# Patient Record
Sex: Female | Born: 1935
Health system: Southern US, Community
[De-identification: ages and names within clinical notes are randomized; demographics above are authoritative.]

## PROBLEM LIST (undated history)

## (undated) DIAGNOSIS — Z9189 Other specified personal risk factors, not elsewhere classified: Secondary | ICD-10-CM

## (undated) DIAGNOSIS — I10 Essential (primary) hypertension: Secondary | ICD-10-CM

## (undated) DIAGNOSIS — E7800 Pure hypercholesterolemia, unspecified: Secondary | ICD-10-CM

## (undated) DIAGNOSIS — I2699 Other pulmonary embolism without acute cor pulmonale: Secondary | ICD-10-CM

## (undated) DIAGNOSIS — K5792 Diverticulitis of intestine, part unspecified, without perforation or abscess without bleeding: Secondary | ICD-10-CM

## (undated) DIAGNOSIS — M1990 Unspecified osteoarthritis, unspecified site: Secondary | ICD-10-CM

## (undated) HISTORY — DX: Other specified personal risk factors, not elsewhere classified: Z91.89

## (undated) HISTORY — PX: TONSILLECTOMY: SUR1361

## (undated) HISTORY — DX: Unspecified osteoarthritis, unspecified site: M19.90

## (undated) HISTORY — PX: CARPAL TUNNEL RELEASE: SHX101

## (undated) HISTORY — PX: APPENDECTOMY: SHX54

## (undated) HISTORY — DX: Diverticulitis of intestine, part unspecified, without perforation or abscess without bleeding: K57.92

## (undated) NOTE — *Deleted (*Deleted)
Patient Care Team: Tracey Harries, MD as PCP - General (Family Medicine)  DIAGNOSIS:    ICD-10-CM   1. Malignant neoplasm of upper-inner quadrant of right breast in female, estrogen receptor positive (HCC)  C50.211    Z17.0     SUMMARY OF ONCOLOGIC HISTORY: Oncology History  Malignant neoplasm of upper-inner quadrant of right breast in female, estrogen receptor positive (HCC)  11/15/2018 Initial Diagnosis   42-month history of enlarging palpable lump in the right breast, mammogram revealed spiculated mass with architectural distortion.  Mass measures 2 cm but with the spiculations measures 4 cm, extensive involvement of overlying skin, left breast 8 mm calcifications, biopsy right breast mass and lymph node positive for invasive lobular cancer, lymph node also positive with extranodal extension, ER 95%, PR 100%, Ki-67 2%, HER-2 2+ by IHC but FISH negative ratio 1.44, left breast biopsy benign   11/22/2018 Cancer Staging   Staging form: Breast, AJCC 8th Edition - Clinical stage from 11/22/2018: Stage IIIB (cT4, cN1, cM0, G2, ER+, PR+, HER2-) - Signed by Serena Croissant, MD on 11/22/2018   11/22/2018 -  Neo-Adjuvant Anti-estrogen oral therapy   Anastrozole     CHIEF COMPLIANT: Follow-up of right breast cancer on neoadjuvant anastrozole  INTERVAL HISTORY: Deborah Bell is a 56 y.o. with above-mentioned history of right breast cancer currently on neoadjuvant anti-estrogen therapy with anastrozole.She presents to the clinic todayfor follow-up.  ALLERGIES:  is allergic to codeine.  MEDICATIONS:  Current Outpatient Medications  Medication Sig Dispense Refill  . amLODipine (NORVASC) 2.5 MG tablet Take 1 tablet (2.5 mg total) by mouth daily. 90 tablet 3  . anastrozole (ARIMIDEX) 1 MG tablet TAKE 1 TABLET(1 MG) BY MOUTH DAILY 90 tablet 3  . CALCIUM PO Take 1 tablet by mouth daily.    . Multiple Vitamins-Minerals (MULTIVITAMIN WITH MINERALS) tablet Take 1 tablet by mouth daily.    .  simvastatin (ZOCOR) 20 MG tablet Take 1 tablet (20 mg total) by mouth daily. 90 tablet 3  . tiZANidine (ZANAFLEX) 4 MG tablet Take 1 tablet (4 mg total) by mouth at bedtime. 20 tablet 0   No current facility-administered medications for this visit.    PHYSICAL EXAMINATION: ECOG PERFORMANCE STATUS: {CHL ONC ECOG PS:[id number]}  There were no vitals filed for this visit. There were no vitals filed for this visit.  BREAST:*** No palpable masses or nodules in either right or left breasts. No palpable axillary supraclavicular or infraclavicular adenopathy no breast tenderness or nipple discharge. (exam performed in the presence of a chaperone)  LABORATORY DATA:  I have reviewed the data as listed CMP Latest Ref Rng & Units 10/04/2017 07/21/2017 03/25/2017  Glucose 70 - 99 mg/dL 914(N) 829(F) 87  BUN 6 - 23 mg/dL 19 23 19   Creatinine 0.40 - 1.20 mg/dL 6.21 3.08 6.57  Sodium 135 - 145 mEq/L 143 142 145  Potassium 3.5 - 5.1 mEq/L 3.8 3.7 3.7  Chloride 96 - 112 mEq/L 103 108 105  CO2 19 - 32 mEq/L 36(H) - 31  Calcium 8.4 - 10.5 mg/dL 84.6 - 9.6  Total Protein 6.0 - 8.3 g/dL 7.7 - 7.1  Total Bilirubin 0.2 - 1.2 mg/dL 0.4 - 0.6  Alkaline Phos 39 - 117 U/L 79 - 75  AST 0 - 37 U/L 23 - 22  ALT 0 - 35 U/L 13 - 16    Lab Results  Component Value Date   WBC 6.0 07/20/2017   HGB 13.6 07/21/2017   HCT 40.0 07/21/2017  MCV 92.7 07/20/2017   PLT 187.0 07/20/2017   NEUTROABS 2.9 07/20/2017    ASSESSMENT & PLAN:  No problem-specific Assessment & Plan notes found for this encounter.    No orders of the defined types were placed in this encounter.  The patient has a good understanding of the overall plan. she agrees with it. she will call with any problems that may develop before the next visit here.  Total time spent: *** mins including face to face time and time spent for planning, charting and coordination of care  Serena Croissant, MD 11/27/2019  I, Kirt Boys Dorshimer, am acting as scribe  for Dr. Serena Croissant.  {insert scribe attestation}

---

## 2009-01-19 HISTORY — PX: HERNIA REPAIR: SHX51

## 2015-08-01 LAB — CBC AND DIFFERENTIAL
HCT: 37 % (ref 36–46)
Hemoglobin: 12.5 g/dL (ref 12.0–16.0)
Platelets: 222 10*3/uL (ref 150–399)
WBC: 6.4 10^3/mL

## 2015-09-19 LAB — BASIC METABOLIC PANEL
BUN: 17 mg/dL (ref 4–21)
Creatinine: 0.9 mg/dL (ref 0.5–1.1)
Glucose: 113 mg/dL
Potassium: 3.7 mmol/L (ref 3.4–5.3)
Sodium: 144 mmol/L (ref 137–147)

## 2015-09-19 LAB — POCT INR: INR: 6.6 — AB (ref 0.9–1.1)

## 2015-09-19 LAB — CBC AND DIFFERENTIAL
HEMATOCRIT: 36 % (ref 36–46)
HEMOGLOBIN: 11.7 g/dL — AB (ref 12.0–16.0)
PLATELETS: 271 10*3/uL (ref 150–399)
WBC: 5.7 10*3/mL

## 2015-09-21 LAB — CBC AND DIFFERENTIAL
PLATELETS: 257 10*3/uL (ref 150–399)
WBC: 5 10*3/mL

## 2015-09-21 LAB — BASIC METABOLIC PANEL
BUN: 8 mg/dL (ref 4–21)
CREATININE: 0.7 mg/dL (ref 0.5–1.1)
GLUCOSE: 93 mg/dL
POTASSIUM: 3.3 mmol/L — AB (ref 3.4–5.3)
Sodium: 144 mmol/L (ref 137–147)

## 2015-09-21 LAB — PROTIME-INR: INR: 4.5 — AB (ref 0.9–1.1)

## 2015-10-22 ENCOUNTER — Encounter (HOSPITAL_COMMUNITY): Payer: Self-pay | Admitting: Emergency Medicine

## 2015-10-22 ENCOUNTER — Emergency Department (HOSPITAL_COMMUNITY)
Admission: EM | Admit: 2015-10-22 | Discharge: 2015-10-22 | Disposition: A | Discharge status: Home / Self Care | Payer: Medicare Other | Attending: Dermatology | Admitting: Dermatology

## 2015-10-22 DIAGNOSIS — I10 Essential (primary) hypertension: Secondary | ICD-10-CM | POA: Insufficient documentation

## 2015-10-22 DIAGNOSIS — R51 Headache: Secondary | ICD-10-CM | POA: Diagnosis not present

## 2015-10-22 DIAGNOSIS — Z5321 Procedure and treatment not carried out due to patient leaving prior to being seen by health care provider: Secondary | ICD-10-CM | POA: Diagnosis not present

## 2015-10-22 DIAGNOSIS — R799 Abnormal finding of blood chemistry, unspecified: Secondary | ICD-10-CM | POA: Diagnosis not present

## 2015-10-22 HISTORY — DX: Essential (primary) hypertension: I10

## 2015-10-22 HISTORY — DX: Pure hypercholesterolemia, unspecified: E78.00

## 2015-10-22 HISTORY — DX: Other pulmonary embolism without acute cor pulmonale: I26.99

## 2015-10-22 NOTE — ED Notes (Signed)
Pt complaining of a bad headache while in the waiting room vitals were  reassessed   And a warm blanket was given

## 2015-10-22 NOTE — ED Notes (Signed)
Pt stated they were going to leave

## 2015-10-22 NOTE — ED Triage Notes (Signed)
Pt states that she just relocated here from [facility] and is on coumadin. Her main concern is that her INR is normally checked weekly because she was on coumadin for PEs. States she is also concerned about her BP because she was taken off of her BP meds and it is high at this time. BP 194/101. Alert and oriented. Asymptomatic.

## 2015-10-26 ENCOUNTER — Encounter (HOSPITAL_COMMUNITY): Payer: Self-pay | Admitting: Emergency Medicine

## 2015-10-26 ENCOUNTER — Emergency Department (HOSPITAL_COMMUNITY): Payer: Medicare Other

## 2015-10-26 ENCOUNTER — Emergency Department (HOSPITAL_COMMUNITY)
Admission: EM | Admit: 2015-10-26 | Discharge: 2015-10-26 | Disposition: A | Discharge status: Home / Self Care | Payer: Medicare Other | Attending: Emergency Medicine | Admitting: Emergency Medicine

## 2015-10-26 DIAGNOSIS — R791 Abnormal coagulation profile: Secondary | ICD-10-CM | POA: Diagnosis not present

## 2015-10-26 DIAGNOSIS — I10 Essential (primary) hypertension: Secondary | ICD-10-CM | POA: Insufficient documentation

## 2015-10-26 DIAGNOSIS — R350 Frequency of micturition: Secondary | ICD-10-CM | POA: Diagnosis present

## 2015-10-26 DIAGNOSIS — N3000 Acute cystitis without hematuria: Secondary | ICD-10-CM | POA: Insufficient documentation

## 2015-10-26 LAB — COMPREHENSIVE METABOLIC PANEL
ALT: 12 U/L — ABNORMAL LOW (ref 14–54)
AST: 23 U/L (ref 15–41)
Albumin: 3.7 g/dL (ref 3.5–5.0)
Alkaline Phosphatase: 59 U/L (ref 38–126)
Anion gap: 6 (ref 5–15)
BUN: 17 mg/dL (ref 6–20)
CHLORIDE: 109 mmol/L (ref 101–111)
CO2: 27 mmol/L (ref 22–32)
Calcium: 9.4 mg/dL (ref 8.9–10.3)
Creatinine, Ser: 0.92 mg/dL (ref 0.44–1.00)
GFR, EST NON AFRICAN AMERICAN: 55 mL/min — AB (ref >60)
Glucose, Bld: 111 mg/dL — ABNORMAL HIGH (ref 65–99)
POTASSIUM: 3.1 mmol/L — AB (ref 3.5–5.1)
Sodium: 142 mmol/L (ref 135–145)
Total Bilirubin: 0.5 mg/dL (ref 0.3–1.2)
Total Protein: 6.6 g/dL (ref 6.5–8.1)

## 2015-10-26 LAB — URINE MICROSCOPIC-ADD ON: RBC / HPF: NONE SEEN RBC/hpf (ref 0–5)

## 2015-10-26 LAB — CBC WITH DIFFERENTIAL/PLATELET
BASOS ABS: 0 10*3/uL (ref 0.0–0.1)
BASOS PCT: 1 %
EOS ABS: 0.2 10*3/uL (ref 0.0–0.7)
Eosinophils Relative: 4 %
HCT: 37 % (ref 36.0–46.0)
HEMOGLOBIN: 11.6 g/dL — AB (ref 12.0–15.0)
Lymphocytes Relative: 38 %
Lymphs Abs: 2.2 10*3/uL (ref 0.7–4.0)
MCH: 29.5 pg (ref 26.0–34.0)
MCHC: 31.4 g/dL (ref 30.0–36.0)
MCV: 94.1 fL (ref 78.0–100.0)
MONOS PCT: 8 %
Monocytes Absolute: 0.5 10*3/uL (ref 0.1–1.0)
NEUTROS PCT: 49 %
Neutro Abs: 2.8 10*3/uL (ref 1.7–7.7)
Platelets: 213 10*3/uL (ref 150–400)
RBC: 3.93 MIL/uL (ref 3.87–5.11)
RDW: 14.7 % (ref 11.5–15.5)
WBC: 5.7 10*3/uL (ref 4.0–10.5)

## 2015-10-26 LAB — URINALYSIS, ROUTINE W REFLEX MICROSCOPIC
Bilirubin Urine: NEGATIVE
Glucose, UA: NEGATIVE mg/dL
Ketones, ur: NEGATIVE mg/dL
Nitrite: NEGATIVE
PROTEIN: NEGATIVE mg/dL
Specific Gravity, Urine: <1.005 — ABNORMAL LOW (ref 1.005–1.030)
pH: 6.5 (ref 5.0–8.0)

## 2015-10-26 LAB — PROTIME-INR
INR: 2
PROTHROMBIN TIME: 23 s — AB (ref 11.4–15.2)

## 2015-10-26 LAB — I-STAT CG4 LACTIC ACID, ED: LACTIC ACID, VENOUS: 1.6 mmol/L (ref 0.5–1.9)

## 2015-10-26 MED ORDER — SODIUM CHLORIDE 0.9 % IV BOLUS (SEPSIS)
1000.0000 mL | Freq: Once | INTRAVENOUS | Status: AC
  Administered 2015-10-26: 1000 mL via INTRAVENOUS

## 2015-10-26 MED ORDER — POTASSIUM CHLORIDE CRYS ER 20 MEQ PO TBCR
20.0000 meq | EXTENDED_RELEASE_TABLET | Freq: Once | ORAL | Status: AC
  Administered 2015-10-26: 20 meq via ORAL
  Filled 2015-10-26: qty 1

## 2015-10-26 MED ORDER — FOSFOMYCIN TROMETHAMINE 3 G PO PACK
3.0000 g | PACK | Freq: Once | ORAL | Status: AC
  Administered 2015-10-26: 3 g via ORAL
  Filled 2015-10-26: qty 3

## 2015-10-26 NOTE — ED Notes (Signed)
Report given to Kim RN.

## 2015-10-26 NOTE — Discharge Instructions (Signed)
Please follow-up with your new doctor to discuss further management of hypokalemia, blood pressure medicines, and your anticoagulation plan on Coumadin.  In terms return or worsen, please return to the nearest emergency department.

## 2015-10-26 NOTE — ED Provider Notes (Signed)
[facility] DEPT Provider Note   CSN: [id number] Arrival date & time: 10/26/15  1229     History   Chief Complaint Chief Complaint  Patient presents with  . Blood Pressure Check  . Labs Only    INR check  . Leg Pain  . Generalized Body Aches    HPI Deborah Bell is a 82 y.o. female With a past medical history significant for DVT on Coumadin therapy, hypertension, vertigo, And hypercholesterolemia who presents for malaise, lightheadedness, fatigue, and frequency. Patient is accompanied by daughter who provides some of the history. Patient and daughter recently moved from [place] and are establishing primary care with a new physician in several days. Initial complaint on arrival was for INR check as patient is taking Coumadin but has not had this level checked in over one week. Patient taking Coumadin due to pulmonary embolism. During history, patient also found to have new complaints of frequency of urination, fatigue, and some lightheadedness. Patient denies fevers, chills, chest pain, shortness of breath, nausea, vomiting, constipation, diarrhea, or dysuria. Patient denies the trauma. Patient denies any neurologic complaints. Patient says she is having more of a lightheaded sensation then dizziness however she does have a history of vertigo. Patient says she is felt similarly When she had UTI.    Illness  This is a new problem. The current episode started yesterday. The problem occurs constantly. The problem has been gradually improving. Pertinent negatives include no chest pain, no abdominal pain, no headaches and no shortness of breath. Associated symptoms comments: Lightheadedness, fatigue, malaise. . Nothing aggravates the symptoms. Nothing relieves the symptoms. She has tried nothing for the symptoms. The treatment provided no relief.    Past Medical History:  Diagnosis Date  . Hypercholesteremia   . Hypertension   . Pulmonary emboli (HCC)     There are no active  problems to display for this patient.   Past Surgical History:  Procedure Laterality Date  . APPENDECTOMY    . CARPAL TUNNEL RELEASE    . HERNIA REPAIR    . TONSILLECTOMY      OB History    No data available       Home Medications    Prior to Admission medications   Not on File    Family History No family history on file.  Social History Social History  Substance Use Topics  . Smoking status: Never Smoker  . Smokeless tobacco: Never Used  . Alcohol use No     Allergies   Review of patient's allergies indicates no known allergies.   Review of Systems Review of Systems  Constitutional: Positive for fatigue. Negative for chills, diaphoresis and fever.  HENT: Negative for congestion and rhinorrhea.   Eyes: Negative for visual disturbance.  Respiratory: Negative for cough, chest tightness, shortness of breath, wheezing and stridor.   Cardiovascular: Negative for chest pain, palpitations and leg swelling.  Gastrointestinal: Negative for abdominal pain, diarrhea, nausea and vomiting.  Genitourinary: Positive for frequency. Negative for dysuria, flank pain and hematuria.  Musculoskeletal: Negative for back pain, neck pain and neck stiffness.  Skin: Negative for wound.  Neurological: Positive for light-headedness. Negative for seizures, syncope, weakness and headaches.  Psychiatric/Behavioral: Negative for agitation and confusion.  All other systems reviewed and are negative.    Physical Exam Updated Vital Signs BP 160/80 (BP Location: Left Arm)   Pulse 71   Temp 97.6 F (36.4 C) (Oral)   Resp 17   Wt 151 lb (68.5 kg)  SpO2 100%   Physical Exam  Constitutional: She is oriented to person, place, and time. She appears well-developed and well-nourished. No distress.  HENT:  Head: Normocephalic and atraumatic.  Mouth/Throat: Oropharynx is clear and moist. No oropharyngeal exudate.  Eyes: Conjunctivae and EOM are normal. Pupils are equal, round, and reactive  to light.  Neck: Normal range of motion. Neck supple.  Cardiovascular: Normal rate, regular rhythm, normal heart sounds and intact distal pulses.   No murmur heard. Pulmonary/Chest: Effort normal and breath sounds normal. No stridor. No respiratory distress. She has no wheezes. She exhibits no tenderness.  Abdominal: Soft. There is no tenderness.  Musculoskeletal: She exhibits no edema.  Neurological: She is alert and oriented to person, place, and time. She has normal reflexes. She is not disoriented. She displays no tremor and normal reflexes. No cranial nerve deficit or sensory deficit. She exhibits normal muscle tone. Coordination normal. GCS eye subscore is 4. GCS verbal subscore is 5. GCS motor subscore is 6.  Skin: Skin is warm and dry. No rash noted.  Psychiatric: She has a normal mood and affect.  Nursing note and vitals reviewed.    ED Treatments / Results  Labs (all labs ordered are listed, but only abnormal results are displayed) Labs Reviewed  CBC WITH DIFFERENTIAL/PLATELET - Abnormal; Notable for the following:       Result Value   Hemoglobin 11.6 (*)    All other components within normal limits  PROTIME-INR - Abnormal; Notable for the following:    Prothrombin Time 23.0 (*)    All other components within normal limits  COMPREHENSIVE METABOLIC PANEL - Abnormal; Notable for the following:    Potassium 3.1 (*)    Glucose, Bld 111 (*)    ALT 12 (*)    GFR calc non Af Amer 55 (*)    All other components within normal limits  URINALYSIS, ROUTINE W REFLEX MICROSCOPIC (NOT AT [facility]) - Abnormal; Notable for the following:    Specific Gravity, Urine <1.005 (*)    Hgb urine dipstick TRACE (*)    Leukocytes, UA SMALL (*)    All other components within normal limits  URINE MICROSCOPIC-ADD ON - Abnormal; Notable for the following:    Squamous Epithelial / LPF 6-30 (*)    Bacteria, UA RARE (*)    All other components within normal limits  URINE CULTURE  I-STAT CG4 LACTIC ACID,  ED    EKG  EKG Interpretation None       Radiology Dg Chest 2 View  Result Date: 10/26/2015 CLINICAL DATA:  Lightheadedness and chills for 1 day. EXAM: CHEST  2 VIEW COMPARISON:  None. FINDINGS: Mild enlargement of the cardiac silhouette. Mediastinal contours appear intact. There is no evidence of focal airspace consolidation, pleural effusion or pneumothorax. Tortuosity and atherosclerotic disease of the aorta noted. Osseous structures are without acute abnormality. Soft tissues are grossly normal. IMPRESSION: Mild enlargement of the cardiac silhouette. Atherosclerotic disease and tortuosity of the thoracic aorta. Electronically Signed   By: Fidela Salisbury M.D.   On: 10/26/2015 14:44    Procedures Procedures (including critical care time)  Medications Ordered in ED Medications  sodium chloride 0.9 % bolus 1,000 mL (0 mLs Intravenous Stopped 10/26/15 1526)  fosfomycin (MONUROL) packet 3 g (3 g Oral Given 10/26/15 1635)  potassium chloride SA (K-DUR,KLOR-CON) CR tablet 20 mEq (20 mEq Oral Given 10/26/15 1642)     Initial Impression / Assessment and Plan / ED Course  I have reviewed the triage  vital signs and the nursing notes.  Pertinent labs & imaging results that were available during my care of the patient were reviewed by me and considered in my medical decision making (see chart for details).  Clinical Course   Deborah Bell is a 82 y.o. female With a past medical history significant for DVT on Coumadin therapy, hypertension, vertigo, and hypercholesterolemia who presents for INR check and subsequently found to have malaise, lightheadedness, fatigue, and frequency  History and exam are seen above.  Due to discovery of other complaints aside from INR question, workup expanded to include testing for occult infection or UTI given frequency.  Patient found to have hypokalemia, this was supplemented orally in ED. Patient has had this problem in the past and will get on a  Potassium supplementation regimen as directed by new PCP.  Patient found INR 2.0 which is within goal of 2 to 3. Patient instructed to continue taking her same Coumadin regiment until seen by PCP. No evidence of pneumonia on chest x-ray.  Given frequency, urinalysis shows concern for UTI with sites and bacteria. Culture sent. Patient treated with one dose of fosfomycin. Pharmacy confirmed no interaction of fosfomycin with Coumadin.  Patient reported feeling much better after fluid resuscitation. Patient did not have any further lightheadedness. Patient and family agreed with discharge and plans to follow up with PCP. Patient instructed to return the emergency department with any new or worsening symptoms. Family had no other questions or concerns and patient discharged good condition.    Final Clinical Impressions(s) / ED Diagnoses   Final diagnoses:  Acute cystitis without hematuria    New Prescriptions Discharge Medication List as of 10/26/2015  4:35 PM      Clinical Impression: 1. Acute cystitis without hematuria     Disposition: Discharge  Condition: Good  I have discussed the results, Dx and Tx plan with the pt(& family if present). He/she/they expressed understanding and agree(s) with the plan. Discharge instructions discussed at great length. Strict return precautions discussed and pt &/or family have verbalized understanding of the instructions. No further questions at time of discharge.    Discharge Medication List as of 10/26/2015  4:35 PM      Follow Up: No follow-up provider specified.      Gwenyth Allegra Silvana Holecek, MD 10/26/15 2212

## 2015-10-26 NOTE — ED Notes (Addendum)
[facility] will draw labs in back.

## 2015-10-26 NOTE — ED Notes (Signed)
Unable to sign for discharge due to pad not working

## 2015-10-26 NOTE — ED Triage Notes (Signed)
Daughter stated, she just moved here from [facility] and she's had a erratic BP and has not had her INR checked and just not feeling good.  Her BP has been all over the place.

## 2015-10-26 NOTE — ED Notes (Signed)
Patient transported to X-ray 

## 2015-10-27 LAB — URINE CULTURE: Culture: <10000 — AB

## 2015-10-29 ENCOUNTER — Ambulatory Visit (INDEPENDENT_AMBULATORY_CARE_PROVIDER_SITE_OTHER): Payer: Medicare Other | Admitting: Family Medicine

## 2015-10-29 ENCOUNTER — Encounter: Payer: Self-pay | Admitting: Family Medicine

## 2015-10-29 VITALS — BP 122/82 | HR 67 | Temp 98.0°F | Resp 16 | Ht <= 58 in | Wt 139.0 lb

## 2015-10-29 DIAGNOSIS — G894 Chronic pain syndrome: Secondary | ICD-10-CM

## 2015-10-29 DIAGNOSIS — I10 Essential (primary) hypertension: Secondary | ICD-10-CM | POA: Insufficient documentation

## 2015-10-29 DIAGNOSIS — E876 Hypokalemia: Secondary | ICD-10-CM | POA: Diagnosis not present

## 2015-10-29 DIAGNOSIS — M5440 Lumbago with sciatica, unspecified side: Secondary | ICD-10-CM

## 2015-10-29 DIAGNOSIS — Z23 Encounter for immunization: Secondary | ICD-10-CM

## 2015-10-29 DIAGNOSIS — R2681 Unsteadiness on feet: Secondary | ICD-10-CM

## 2015-10-29 DIAGNOSIS — M1712 Unilateral primary osteoarthritis, left knee: Secondary | ICD-10-CM

## 2015-10-29 DIAGNOSIS — R42 Dizziness and giddiness: Secondary | ICD-10-CM

## 2015-10-29 DIAGNOSIS — M545 Low back pain, unspecified: Secondary | ICD-10-CM | POA: Insufficient documentation

## 2015-10-29 LAB — POTASSIUM: POTASSIUM: 3.6 meq/L (ref 3.5–5.1)

## 2015-10-29 NOTE — Patient Instructions (Addendum)
A few things to remember from today's visit:   Hypokalemia - Plan: Potassium  Chronic pain disorder - Plan: Ambulatory referral to Pain Clinic  Essential hypertension  Unstable gait - Plan: Ambulatory referral to [facility]  Primary osteoarthritis of left knee - Plan: Ambulatory referral to Pain Clinic, Ambulatory referral to Home Health  Vertigo Fall precautions. Home health will be arranged. Stop Coumadin. Monitor blood pressure at home. Next office visit we will check cholesterol.  Please be sure medication list is accurate. If a new problem present, please set up appointment sooner than planned today.

## 2015-10-29 NOTE — Progress Notes (Signed)
HPI:   [name] is a 82 y.o. female, who is here today with her daughter to establish care with me.  Former PCP: Dr Dominica Severin in [facility]. Last preventive routine visit: many years ago.  Concerns today: daughter would like [facility] and referral to continue following on some of her chronic medical problems. She just moved from [place] about a week ago.   Hyperlipidemia:  Not sure about last lab work. She is currently on Zocor 20 mg daily.   Hx of DVT and PE, first episode, currently she is on Coumadin. According to daughter, she was evaluated by hematologists and lab work was dine to determine etiology, has not received results. She has been on Coumadin for about 5 months, tolerating well, last INR over a month ago.    Chronic pain: Daughter states that she has had pain "forever" over 20 year: back pain bilateral, radiated to RLE with numbness. Epidural injections, which she received regularly in [place], helped for 3-4 months. Denies new associated symptoms, mild intermittent perianal numbness but denies stool/bowel incontinence.  L>R chronic knee pain. + Stiffness.  C/O "Intense" knee and back pain, constant, sharp/acy pain. Exacerbated by movement, walking, prolonged sitting. Alleviated by changing positions and rest.  She was receiving epidural injections from pain management and knee injections for orthopedists.  Hx of frequent falls, last fall about a month ago, precede by dizziness when she was turning to her room.  + Spinning sensation, Hx of vertigo. Reporting ENT evaluation in the past when first diagnosed and recommended vestibular exercises; which helped greatly, still has handouts at home.  No tinnitus or hearing loss.  HTN, antihypertensive medication was discontinued during hospitalization in [place], 06/2015.  BP readings at home: 130-140's/70-80's. She denies Hx of CVD. Denies orthopnea or PND.  Denies severe/frequent headache, visual changes,  chest pain, dyspnea, palpitation, claudication, focal weakness, or worsening LE edema.  Currently she is living with her daughter. She needs help with some ADLs: Shower, dressing, and transfer among some. She has a walker at home.  She was seen in the ER on 10/26/15 because urinary symptoms, Dx with cystitis. Symptoms resolved. K+ low at 3.1.   Review of Systems  Constitutional: Negative for activity change, appetite change, fever and unexpected weight change.  HENT: Negative for mouth sores, nosebleeds and trouble swallowing.   Eyes: Negative for redness and visual disturbance.  Respiratory: Negative for cough, shortness of breath and wheezing.   Cardiovascular: Negative for chest pain, palpitations and leg swelling.  Gastrointestinal: Negative for abdominal pain, nausea and vomiting.       Negative for changes in bowel habits.  Genitourinary: Negative for decreased urine volume, difficulty urinating, dysuria and hematuria.  Musculoskeletal: Positive for arthralgias, back pain and gait problem.  Skin: Negative for color change and rash.  Neurological: Negative for syncope, weakness and headaches.  Psychiatric/Behavioral: Negative for confusion and sleep disturbance. The patient is nervous/anxious.       Current Outpatient Prescriptions on File Prior to Visit  Medication Sig Dispense Refill  . CALCIUM PO Take 1 tablet by mouth daily.    . simvastatin (ZOCOR) 20 MG tablet Take 20 mg by mouth daily.     No current facility-administered medications on file prior to visit.      Past Medical History:  Diagnosis Date  . Arthritis   . Diverticulitis   . Heart murmur   . History of fainting spells of unknown cause   . Hypercholesteremia   .  Hypertension   . Pulmonary emboli (HCC)    No Known Allergies  Family History  Problem Relation Age of Onset  . Diabetes Mother   . Diabetes Father     Social History   Social History  . Marital status: Legally Separated    Spouse  name: N/A  . Number of children: N/A  . Years of education: N/A   Social History Main Topics  . Smoking status: Never Smoker  . Smokeless tobacco: Never Used  . Alcohol use No  . Drug use: No  . Sexual activity: Not Currently   Other Topics Concern  . None   Social History Narrative  . None    Vitals:   10/29/15 0846  BP: 122/82  Pulse: 67  Resp: 16  Temp: 98 F (36.7 C)   O2 sat at RA 95%  Body mass index is 29.56 kg/m.    Physical Exam  Nursing note and vitals reviewed. Constitutional: She is oriented to person, place, and time. She appears well-developed. No distress.  HENT:  Head: Atraumatic.  Mouth/Throat: Oropharynx is clear and moist and mucous membranes are normal.  Eyes: Conjunctivae and EOM are normal. Pupils are equal, round, and reactive to light.  Neck: No JVD present. No tracheal deviation present. No thyroid mass and no thyromegaly present.  Cardiovascular: Normal rate and regular rhythm.   Murmur: SEM I-II/VI RUSB and LUSB. Pulses:      Dorsalis pedis pulses are 2+ on the right side, and 2+ on the left side.  Varicose veins LE bilateral.  Respiratory: Effort normal and breath sounds normal. No respiratory distress.  GI: Soft. There is no hepatomegaly. There is no tenderness. A hernia (reducible,no tender) is present.    Musculoskeletal: She exhibits edema (peri-ankle edema bilateral.).  Knee:moderate limitation of flexion, pain elicited, bilateral.  + Moderate crepitus, knees.  No pain upon palpation of paraspinal muscles, thoracic and lumbar bilateral.  Neurological: She is alert and oriented to person, place, and time. She has normal strength. Coordination normal.  Reflex Scores:      Patellar reflexes are 2+ on the right side and 2+ on the left side. SLR negative bilateral. Antalgic/unstable gait, assisted with cane today.  Skin: Skin is warm. No erythema.  Psychiatric: Her mood appears anxious.  Well groomed, good eye contact.       ASSESSMENT AND PLAN:     [name] was seen today for establish care.  Diagnoses and all orders for this visit:    Unstable gait        HH will be arranged, she may need a walker and a toilet seat. Fall precautions. Recommend using walker, today she has a cane. PT to be arranged through [facility].  -     Ambulatory referral to Home Health  Chronic pain disorder -     Ambulatory referral to Pain Clinic  Low back pain with radiation, unspecified laterality  Stable. According to pt, she was receiving periodic epidural injections from pain clinic provider. Referral to pain management. Instructed about warning signs.   Primary osteoarthritis of left knee  We will hold on ortho referral. Pain management may be able to continue knee injections.   -     Ambulatory referral to Pain Clinic -     Ambulatory referral to Home Health  Hypokalemia  Will give further recommendations according to lab result.  -     Potassium  Essential hypertension  Continue nonpharmacologic treatment for now. At the time of this  visit I do not have records from former PCP, aware of heart murmur.  Continue monitoring BP at home. Low salt diet. Goal BP < 150/90. F/U in 1-2 months.  . Vertigo  Vestibular exercise , she has handouts still, [place] modified maneuvers. Fall precautions. If needed we may consider a short course of Meclizine, some side effects discussed. F/U in 1-2 months.   Need for immunization against influenza -     Flu vaccine HIGH DOSE PF       Gradie Butrick G. [place], MD  [facility]. [place] office.

## 2015-10-30 ENCOUNTER — Telehealth: Payer: Self-pay | Admitting: Family Medicine

## 2015-10-30 NOTE — Telephone Encounter (Signed)
Deborah Bell can not accommodate pt for after 5 pm home heatlh visit etc.

## 2015-10-31 ENCOUNTER — Encounter: Payer: Self-pay | Admitting: Family Medicine

## 2015-11-02 ENCOUNTER — Encounter: Payer: Self-pay | Admitting: Family Medicine

## 2015-11-26 ENCOUNTER — Encounter: Payer: Self-pay | Admitting: Family Medicine

## 2015-11-26 ENCOUNTER — Ambulatory Visit (INDEPENDENT_AMBULATORY_CARE_PROVIDER_SITE_OTHER): Payer: Medicare Other | Admitting: Family Medicine

## 2015-11-26 VITALS — BP 162/80 | HR 86 | Temp 98.2°F | Resp 12 | Ht <= 58 in | Wt 137.5 lb

## 2015-11-26 DIAGNOSIS — M1712 Unilateral primary osteoarthritis, left knee: Secondary | ICD-10-CM | POA: Diagnosis not present

## 2015-11-26 DIAGNOSIS — M545 Low back pain, unspecified: Secondary | ICD-10-CM

## 2015-11-26 DIAGNOSIS — I10 Essential (primary) hypertension: Secondary | ICD-10-CM

## 2015-11-26 DIAGNOSIS — M5440 Lumbago with sciatica, unspecified side: Secondary | ICD-10-CM

## 2015-11-26 MED ORDER — DICLOFENAC SODIUM 1 % TD GEL: 4.0000 g | Freq: Four times a day (QID) | TRANSDERMAL | 2 refills | Status: DC

## 2015-11-26 MED ORDER — GABAPENTIN 100 MG PO CAPS: ORAL_CAPSULE | ORAL | 1 refills | Status: DC

## 2015-11-26 MED ORDER — AMLODIPINE BESYLATE 2.5 MG PO TABS: 2.5000 mg | ORAL_TABLET | Freq: Every day | ORAL | 3 refills | Status: DC

## 2015-11-26 NOTE — Progress Notes (Signed)
Deborah Bell is a 82 y.o.female, who is here today to follow on HTN and some other chronic problems.  She has Hx of chronic pain, lower back with radiculopathy and left knee mainly, last OV she was referred to pain management to continue care she was receiving back in [place]. Daughter is upset because she has not received information about date of appointment. According to daughter, she called to the office and she was told appointment will be arranged after records from former PCP are reviewed.  She is complaining of severe lower back pain, worse in the morning when first gets up and after prolonged rest. Pain is radiated to RLE with numbness, occasionally she feels some perianal numbness, denies incontinence. Usually epidural injection has helped with all these symptoms, she was having them q 3-4 months.  No new associated symptoms.  Pain is worse in the morning when she first tries to move, stiffness; exacerbated by prolonged standing, walking, and prolonged sitting. Position changes help some.  She has [facility] arrangements in process and planning on having a nurse aid 5 times per week to help with ADL's and PT.  Daughter is requesting prescription for prednisone, according to her, she was taking prednisone intermittently to help with back pain and knee pain. She is also requesting referral to ortho, so she can resume knee intra articular injections while she is evaluated by pain management.  -Daughter is also concerned about decreased appetite, she is still eating at least 3 meals daily but if she is not asked to eat she will not do so.  She denies depressed mood.   HTN:  Home BP readings 150-160's/80's.  She has not noted unusual headache, visual changes, exertional chest pain, dyspnea, or  focal weakness..  In the past she was on Amlodipine 5 mg daily, which was discontinued after hospitalization a few months ago. Currently on non pharmacologic treatment.   Lab  Results  Component Value Date   CREATININE 0.92 10/26/2015   BUN 17 10/26/2015   NA 142 10/26/2015   K 3.6 10/29/2015   CL 109 10/26/2015   CO2 27 10/26/2015    Hx of DVT, stopped Coumadin after completing 3-4 months pf treatment. Denies calf pain, erythema, or worsening edema.  Daughter also brought FMLA for her, so she can leave work to bring [name] to her appts.   Review of Systems  Constitutional: Positive for appetite change and fatigue. Negative for activity change, fever and unexpected weight change.  HENT: Negative for mouth sores, nosebleeds and trouble swallowing.   Eyes: Negative for pain, redness and visual disturbance.  Respiratory: Negative for cough, shortness of breath and wheezing.   Cardiovascular: Negative for chest pain, palpitations and leg swelling.  Gastrointestinal: Negative for abdominal pain, nausea and vomiting.       Negative for changes in bowel habits.  Genitourinary: Negative for decreased urine volume, difficulty urinating and hematuria.  Musculoskeletal: Positive for arthralgias, back pain and gait problem.  Neurological: Positive for numbness. Negative for syncope, weakness and headaches.  Psychiatric/Behavioral: Positive for sleep disturbance (due to pain). Negative for confusion. The patient is nervous/anxious.      Current Outpatient Prescriptions on File Prior to Visit  Medication Sig Dispense Refill  . CALCIUM PO Take 1 tablet by mouth daily.    . simvastatin (ZOCOR) 20 MG tablet Take 20 mg by mouth daily.     No current facility-administered medications on file prior to visit.  Past Medical History:  Diagnosis Date  . Arthritis   . Diverticulitis   . History of fainting spells of unknown cause   . Hypercholesteremia   . Hypertension   . Pulmonary emboli (HCC)     No Known Allergies  Social History   Social History  . Marital status: Legally Separated    Spouse name: N/A  . Number of children: N/A  . Years of  education: N/A   Social History Main Topics  . Smoking status: Never Smoker  . Smokeless tobacco: Never Used  . Alcohol use No  . Drug use: No  . Sexual activity: Not Currently   Other Topics Concern  . None   Social History Narrative  . None    Vitals:   11/26/15 1524  BP: (!) 162/80  Pulse: 86  Resp: 12  Temp: 98.2 F (36.8 C)   Body mass index is 29.24 kg/m.    Physical Exam  Nursing note and vitals reviewed. Constitutional: She is oriented to person, place, and time. She appears well-developed. No distress.  HENT:  Head: Atraumatic.  Mouth/Throat: Oropharynx is clear and moist and mucous membranes are normal.  Eyes: Conjunctivae and EOM are normal.  Cardiovascular: Normal rate and regular rhythm.   Murmur (SEM I-II/VI RUSB and LUSB) heard. Pulses:      Dorsalis pedis pulses are 2+ on the right side, and 2+ on the left side.  Varicose veins LE bilateral.  Respiratory: Effort normal and breath sounds normal. No respiratory distress.  GI: Soft. There is no tenderness.  Musculoskeletal: She exhibits edema (peri-ankle edema bilateral.).  No tenderness upon palpation of lumbar paraspinal muscles bilateral. Knee crepitus, L>R. Left knee pain with ROM, limited flexion (moderate) and pain elicited.   Neurological: She is alert and oriented to person, place, and time. She has normal strength. Gait abnormal. Coordination normal.  Antalgic/unstable gait, assisted with cane.  Skin: Skin is warm. No erythema.  Psychiatric: Her mood appears anxious.  Well groomed, good eye contact.    ASSESSMENT AND PLAN:   Benjie was seen today for follow-up.  Diagnoses and all orders for this visit:  Primary osteoarthritis of left knee  As requested ortho referral placed. Fall precautions discussed. Topical Voltaren to try. May need to consider Tramadol if by the time she comes back she has not seen pain management.  -     Ambulatory referral to Orthopedic Surgery -      diclofenac sodium (VOLTAREN) 1 % GEL; Apply 4 g topically 4 (four) times daily.  Essential hypertension  Not well controlled. Possible complications of elevated BP discussed. Periodic eye examination. F/U in 4-6 weeks.   -     amLODipine (NORVASC) 2.5 MG tablet; Take 1 tablet (2.5 mg total) by mouth daily.  Low back pain with radiation, unspecified laterality  After discussion of some side effects she agrees with trying Gabapentin, start with 100 mg and titrate up to 300 mg. Instructed about warning signs.   -     gabapentin (NEURONTIN) 100 MG capsule; 1 cap at bedtime and titrate up as tolerated q 5 days to goal 300 mg at bedtime.    Face to face 3:50 pm-4:20 pm. > 50% of time dedicated to coordiination of care. I explained daughter and pt process for pain managemnt evaluation, usually their office want to evaluate prior records before appt is arranged. Also discussed side effects of medications, possible complications, and prognosis (mainly in regard to her back and knee pain).     -  Ms. RISSA FRALEIGH was advised to return sooner than planned today if new concerns arise.     Jahniya Duzan G. Martinique, MD  [facility]. [place] office.

## 2015-11-26 NOTE — Progress Notes (Signed)
Pre visit review using our clinic review tool, if applicable. No additional management support is needed unless otherwise documented below in the visit note. 

## 2015-11-26 NOTE — Patient Instructions (Signed)
A few things to remember from today's visit:   Primary osteoarthritis of left knee - Plan: Ambulatory referral to Orthopedic Surgery, diclofenac sodium (VOLTAREN) 1 % GEL  Essential hypertension - Plan: amLODipine (NORVASC) 2.5 MG tablet  Low back pain with radiation, unspecified laterality - Plan: gabapentin (NEURONTIN) 100 MG capsule  New medications started.  Monitor blood pressure at home. Fall precautions.  Gabapentin at bedtime.   Please be sure medication list is accurate. If a new problem present, please set up appointment sooner than planned today.

## 2015-12-04 ENCOUNTER — Telehealth: Payer: Self-pay

## 2015-12-04 NOTE — Telephone Encounter (Signed)
Called and left VM letting patient's daughter know that the FMLA work is completed. Advised that I would place it up front for her to pick up and that we would be out of the office this afternoon & tomorrow. If it does need to be faxed, I advised that we can fax it on Friday.

## 2015-12-09 ENCOUNTER — Ambulatory Visit (INDEPENDENT_AMBULATORY_CARE_PROVIDER_SITE_OTHER): Payer: Medicare Other

## 2015-12-09 ENCOUNTER — Encounter (INDEPENDENT_AMBULATORY_CARE_PROVIDER_SITE_OTHER): Payer: Self-pay | Admitting: Orthopaedic Surgery

## 2015-12-09 ENCOUNTER — Ambulatory Visit (INDEPENDENT_AMBULATORY_CARE_PROVIDER_SITE_OTHER): Payer: Medicare Other | Admitting: Orthopaedic Surgery

## 2015-12-09 VITALS — BP 177/83 | HR 72 | Resp 14 | Ht <= 58 in | Wt 137.0 lb

## 2015-12-09 DIAGNOSIS — M25562 Pain in left knee: Secondary | ICD-10-CM

## 2015-12-09 DIAGNOSIS — G8929 Other chronic pain: Secondary | ICD-10-CM

## 2015-12-09 DIAGNOSIS — M25561 Pain in right knee: Secondary | ICD-10-CM

## 2015-12-09 MED ORDER — LIDOCAINE HCL 1 % IJ SOLN
5.0000 mL | INTRAMUSCULAR | Status: AC | PRN
  Administered 2015-12-09: 5 mL

## 2015-12-09 MED ORDER — METHYLPREDNISOLONE ACETATE 40 MG/ML IJ SUSP
80.0000 mg | INTRAMUSCULAR | Status: AC | PRN
  Administered 2015-12-09: 80 mg

## 2015-12-09 MED ORDER — BUPIVACAINE HCL 0.5 % IJ SOLN
3.0000 mL | INTRAMUSCULAR | Status: AC | PRN
  Administered 2015-12-09: 3 mL via INTRA_ARTICULAR

## 2015-12-09 NOTE — Progress Notes (Signed)
Office Visit Note   Patient: Deborah Bell           Date of Birth: 1928/08/07           MRN: [id number] Visit Date: 12/09/2015              Requested by: Deborah G Martinique, MD [address] [place], [place] [address] PCP: Deborah Martinique, MD   Assessment & Plan: Visit Diagnoses: Deborah Bell has evidence of bilateral knee osteoarthritis. I will plan on injecting the left knee with cortisone today and have her follow up in 2 weeks to consider injecting the right knee. We'll also have her precertified for Visco supplementation  Plan: f/u in 2 weeks for right knee injection. [place] about different treatment options associated with her osteoarthritis and like to continue to pursue cortisone injections with Visco supplementation. At some point we may want to consider physical therapy  Follow-Up Instructions: No Follow-up on file.   Orders:  No orders of the defined types were placed in this encounter.  No orders of the defined types were placed in this encounter.     Procedures: Large Joint Inj Date/Time: 12/09/2015 11:39 AM Performed by: Garald Balding Authorized by: Garald Balding   Consent Given by:  Patient Timeout: prior to procedure the correct patient, procedure, and site was verified   Indications:  Pain and joint swelling Location:  Knee Site:  L knee Prep: patient was prepped and draped in usual sterile fashion   Needle Size:  25 G Needle Length:  1.5 inches Approach:  Anteromedial Ultrasound Guidance: No   Fluoroscopic Guidance: No   Arthrogram: No   Medications:  5 mL lidocaine 1 %; 80 mg methylPREDNISolone acetate 40 MG/ML; 3 mL bupivacaine 0.5 % Aspiration Attempted: No   Patient tolerance:  Patient tolerated the procedure well with no immediate complications     Clinical Data: No additional findings.   Subjective: No chief complaint on file. Deborah Bell is accompanied by her daughter and here for evaluation of bilateral knee  pain . She recently moved from [place] to be with her daughter. She does have a history of osteoarthritis of both knees and apparently has had prior cortisone injections. She also has a chronic problem with her lumbar spine and received injections for that here while living in [place]. I have asked the daughter to see if that she can obtain the old records including any diagnostic studies and records of injections  She no longer is taking any anticoagulant medicine.  Pt had multiple PE's in her lungs and clot in her Left leg in July, 2017   Pt is here with her daughter to evaluate her for lower lumbar pain. She has relocated from [place] and has seen pain management. Here today for evaluation for her pain in LBP and her Left knee.       Review of Systems  Constitutional: Negative.   HENT: Negative.   Eyes: Negative.   Respiratory: Negative.   Cardiovascular: Positive for leg swelling.  Endocrine: Negative.   Musculoskeletal: Negative.   Skin: Negative.   Allergic/Immunologic: Negative.   Neurological: Positive for dizziness.  Hematological: Negative.   Psychiatric/Behavioral: Negative.      Objective: Vital Signs: Ht 4' 9.5" (1.461 m)   Wt 137 lb (62.1 kg)   BMI 29.13 kg/m   Physical Exam  Ortho Exam examination the left knee reveal very minimal effusion. She lacked about 10 to full extension and could flex about 103  or 4 without instability. There was no calf pain or popliteal mass. Very minimal swelling of her ankle neurologically was intact. She experienced more lateral than medial joint pain.  Examination of the right knee was similar to that of the left. She did lack a few degrees to full extension associated the very small effusion. The knee was hot warm or red. There was more lateral than medial joint pain associated with some patellar crepitation. Straight leg raise is negative bilaterally.  Specialty Comments:  No specialty comments available.  Imaging: No  results found.   PMFS History: Patient Active Problem List   Diagnosis Date Noted  . Chronic pain disorder 10/29/2015  . Essential hypertension 10/29/2015  . Unstable gait 10/29/2015  . Osteoarthritis of left knee 10/29/2015  . Vertigo 10/29/2015  . Low back pain with radiation, unspecified laterality 10/29/2015   Past Medical History:  Diagnosis Date  . Arthritis   . Diverticulitis   . History of fainting spells of unknown cause   . Hypercholesteremia   . Hypertension   . Pulmonary emboli (HCC)     Family History  Problem Relation Age of Onset  . Diabetes Mother   . Diabetes Father     Past Surgical History:  Procedure Laterality Date  . APPENDECTOMY    . CARPAL TUNNEL RELEASE    . HERNIA REPAIR     mesh removed  . TONSILLECTOMY     Social History   Occupational History  . Not on file.   Social History Main Topics  . Smoking status: Never Smoker  . Smokeless tobacco: Never Used  . Alcohol use No  . Drug use: No  . Sexual activity: Not Currently

## 2015-12-11 ENCOUNTER — Ambulatory Visit (INDEPENDENT_AMBULATORY_CARE_PROVIDER_SITE_OTHER): Payer: Self-pay | Admitting: Orthopedic Surgery

## 2015-12-25 ENCOUNTER — Telehealth (INDEPENDENT_AMBULATORY_CARE_PROVIDER_SITE_OTHER): Payer: Self-pay | Admitting: Orthopaedic Surgery

## 2015-12-25 ENCOUNTER — Telehealth: Payer: Self-pay

## 2015-12-25 DIAGNOSIS — M1712 Unilateral primary osteoarthritis, left knee: Secondary | ICD-10-CM

## 2015-12-25 NOTE — Telephone Encounter (Signed)
Referral placed & VM left on daughter's phone letting her know.

## 2015-12-25 NOTE — Telephone Encounter (Signed)
Deborah Bell from [facility] has questions regarding a prescription. Please call them back at [phone]

## 2015-12-25 NOTE — Telephone Encounter (Signed)
Patient's daughter left VM asking for a referral to Dr. Ernestina Patches at [facility] for patient's back shots. Referral okay?

## 2015-12-27 ENCOUNTER — Encounter (INDEPENDENT_AMBULATORY_CARE_PROVIDER_SITE_OTHER): Payer: Self-pay | Admitting: Orthopaedic Surgery

## 2015-12-27 ENCOUNTER — Ambulatory Visit (INDEPENDENT_AMBULATORY_CARE_PROVIDER_SITE_OTHER): Payer: Medicare Other | Admitting: Orthopaedic Surgery

## 2015-12-27 VITALS — BP 164/98 | HR 74 | Resp 14 | Ht <= 58 in | Wt 130.0 lb

## 2015-12-27 DIAGNOSIS — M25561 Pain in right knee: Secondary | ICD-10-CM | POA: Diagnosis not present

## 2015-12-27 DIAGNOSIS — M170 Bilateral primary osteoarthritis of knee: Secondary | ICD-10-CM

## 2015-12-27 DIAGNOSIS — M1711 Unilateral primary osteoarthritis, right knee: Secondary | ICD-10-CM | POA: Diagnosis not present

## 2015-12-27 MED ORDER — BUPIVACAINE HCL 0.5 % IJ SOLN
3.0000 mL | INTRAMUSCULAR | Status: AC | PRN
  Administered 2015-12-27: 3 mL via INTRA_ARTICULAR

## 2015-12-27 MED ORDER — LIDOCAINE HCL 1 % IJ SOLN
5.0000 mL | INTRAMUSCULAR | Status: AC | PRN
  Administered 2015-12-27: 5 mL

## 2015-12-27 MED ORDER — METHYLPREDNISOLONE ACETATE 40 MG/ML IJ SUSP
80.0000 mg | INTRAMUSCULAR | Status: AC | PRN
  Administered 2015-12-27: 80 mg

## 2015-12-27 NOTE — Telephone Encounter (Signed)
returned Jason's call, had to leave message at 8am

## 2015-12-27 NOTE — Progress Notes (Signed)
   Office Visit Note   Patient: Deborah Bell           Date of Birth: 04-07-1928           MRN: [id number] Visit Date: 12/27/2015              Requested by: Betty G Martinique, MD [place], [place] [address] PCP: Betty Martinique, MD   Assessment & Plan: Visit Diagnoses: Bilateral osteoarthritis of the knees.  Plan: Follow up as needed. I plan on injecting her right knee today as she did very well with an injection of her left knee several weeks ago.  Follow-Up Instructions: No Follow-up on file.   Orders:  No orders of the defined types were placed in this encounter.  No orders of the defined types were placed in this encounter.     Procedures: Large Joint Inj Date/Time: 12/27/2015 11:28 AM Performed by: Garald Balding Authorized by: Garald Balding   Consent Given by:  Patient Timeout: prior to procedure the correct patient, procedure, and site was verified   Indications:  Pain and joint swelling Location:  Knee Site:  R knee Prep: patient was prepped and draped in usual sterile fashion   Needle Size:  25 G Needle Length:  1.5 inches Approach:  Anteromedial Ultrasound Guidance: No   Fluoroscopic Guidance: No   Arthrogram: No   Medications:  5 mL lidocaine 1 %; 80 mg methylPREDNISolone acetate 40 MG/ML; 3 mL bupivacaine 0.5 % Aspiration Attempted: No   Patient tolerance:  Patient tolerated the procedure well with no immediate complications     Clinical Data: No additional findings.   Subjective: No chief complaint on file.   Pt had cortisone shot in her Left knee and it worked very well. She is here today to get a cortisone shot in her Right knee    Review of Systems   Objective: Vital Signs: There were no vitals taken for this visit.  Physical Exam  Ortho Exam right knee exam reveals very minimal effusion. The knee was not hot warm or swollen. Minimal medial joint pain. Full extension. Flexion over 100 without instability.  No calf pain no ankle swelling neurovascular exam intact  Specialty Comments:  No specialty comments available.  Imaging: No results found.   PMFS History: Patient Active Problem List   Diagnosis Date Noted  . Chronic pain disorder 10/29/2015  . Essential hypertension 10/29/2015  . Unstable gait 10/29/2015  . Osteoarthritis of left knee 10/29/2015  . Vertigo 10/29/2015  . Low back pain with radiation, unspecified laterality 10/29/2015   Past Medical History:  Diagnosis Date  . Arthritis   . Diverticulitis   . History of fainting spells of unknown cause   . Hypercholesteremia   . Hypertension   . Pulmonary emboli (HCC)     Family History  Problem Relation Age of Onset  . Diabetes Mother   . Diabetes Father     Past Surgical History:  Procedure Laterality Date  . APPENDECTOMY    . CARPAL TUNNEL RELEASE    . HERNIA REPAIR     mesh removed  . TONSILLECTOMY     Social History   Occupational History  . Not on file.   Social History Main Topics  . Smoking status: Never Smoker  . Smokeless tobacco: Never Used  . Alcohol use No  . Drug use: No  . Sexual activity: Not Currently

## 2015-12-31 ENCOUNTER — Encounter: Payer: Self-pay | Admitting: Family Medicine

## 2015-12-31 ENCOUNTER — Ambulatory Visit (INDEPENDENT_AMBULATORY_CARE_PROVIDER_SITE_OTHER): Payer: Medicare Other | Admitting: Family Medicine

## 2015-12-31 VITALS — BP 148/78 | HR 98 | Resp 12 | Ht <= 58 in | Wt 139.0 lb

## 2015-12-31 DIAGNOSIS — M5440 Lumbago with sciatica, unspecified side: Secondary | ICD-10-CM | POA: Diagnosis not present

## 2015-12-31 DIAGNOSIS — I10 Essential (primary) hypertension: Secondary | ICD-10-CM | POA: Diagnosis not present

## 2015-12-31 DIAGNOSIS — M1712 Unilateral primary osteoarthritis, left knee: Secondary | ICD-10-CM | POA: Diagnosis not present

## 2015-12-31 DIAGNOSIS — M545 Low back pain, unspecified: Secondary | ICD-10-CM

## 2015-12-31 MED ORDER — AMLODIPINE BESYLATE 2.5 MG PO TABS: 2.5000 mg | ORAL_TABLET | Freq: Every day | ORAL | 1 refills | Status: DC

## 2015-12-31 NOTE — Progress Notes (Signed)
Pre visit review using our clinic review tool, if applicable. No additional management support is needed unless otherwise documented below in the visit note. 

## 2015-12-31 NOTE — Progress Notes (Signed)
HPI:   [name] is a 82 y.o. female, who is here today with her daughter to follow on hypertension as well as other problems discussed last OV.  Hypertension: Last OV antihypertensive medications was recommended, lower dose she took in the past. She is not taking Amlodipine 2.5 mg daily but rather as needed when BP elevated.   BP reading 128/70,150's/80's.  Denies severe/frequent headache, visual changes, chest pain, dyspnea, palpitation, claudication, focal weakness, or worsening edema.    Knee OA: Voltaren gel was expensive, so did not fill it.  Knee pain great improvement with bilateral knee injection, 12/09/15. She is glad with results.   Last OV Gabapentin was also recommended because lower back pain with radiculopathy, she didn't tolerating medication, increased stiffness; so discontinued. Symptoms otherwise stable.  Pending follow up with pain clinic to discuss epidural injection.    She has no new concerns today.   Review of Systems  Constitutional: Positive for fatigue (improved). Negative for fever and unexpected weight change.  Eyes: Negative for pain, redness and visual disturbance.  Respiratory: Negative for cough, shortness of breath and wheezing.   Cardiovascular: Negative for chest pain and palpitations.  Gastrointestinal: Negative for abdominal pain, nausea and vomiting.       Negative for changes in bowel habits.  Genitourinary: Negative for decreased urine volume and hematuria.  Musculoskeletal: Positive for back pain.  Neurological: Negative for syncope, weakness and headaches.  Psychiatric/Behavioral: Negative for confusion.      Current Outpatient Prescriptions on File Prior to Visit  Medication Sig Dispense Refill  . CALCIUM PO Take 1 tablet by mouth daily.    . simvastatin (ZOCOR) 20 MG tablet Take 20 mg by mouth daily.     No current facility-administered medications on file prior to visit.      Past Medical  History:  Diagnosis Date  . Arthritis   . Diverticulitis   . History of fainting spells of unknown cause   . Hypercholesteremia   . Hypertension   . Pulmonary emboli (HCC)    No Known Allergies  Social History   Social History  . Marital status: Legally Separated    Spouse name: N/A  . Number of children: N/A  . Years of education: N/A   Social History Main Topics  . Smoking status: Never Smoker  . Smokeless tobacco: Never Used  . Alcohol use No  . Drug use: No  . Sexual activity: Not Currently   Other Topics Concern  . None   Social History Narrative  . None    Vitals:   12/31/15 1514  BP: (!) 148/78  Pulse: 98  Resp: 12   Body mass index is 29.56 kg/m.   Physical Exam  Nursing note and vitals reviewed. Constitutional: She is oriented to person, place, and time. She appears well-developed. No distress.  HENT:  Head: Atraumatic.  Eyes: Conjunctivae are normal.  Cardiovascular: Normal rate and regular rhythm.   Murmur (SEM I-II/VI RUSB and LUSB) heard. Respiratory: Effort normal and breath sounds normal. No respiratory distress.  Musculoskeletal: She exhibits no edema.  Neurological: She is alert and oriented to person, place, and time. She has normal strength. Coordination normal.  stable gait, assisted with cane and holding daughter's arm.  Skin: Skin is warm. No erythema.  Psychiatric: She has a normal mood and affect.  Well groomed, good eye contact.      ASSESSMENT AND PLAN:     Deborah Bell was seen today for follow-up.  Diagnoses and all orders for this visit:   Essential hypertension  Not well controlled. Possible complications of elevated BP discussed.  Recommend taking medication daily, continue monitoring BP at home.  F/U in 3-4 months.  -     amLODipine (NORVASC) 2.5 MG tablet; Take 1 tablet (2.5 mg total) by mouth daily.  Low back pain with radiation, unspecified laterality  She did not tolerated Gabapentin. Pending appt with  pain management for possible epidural injection.  Primary osteoarthritis of left knee  Improved with knee injection. She will continue following with Dr Durward Fortes. Fall precautions discussed.       -Ms. Deborah Bell was advised to return sooner than planned today if new concerns arise.       Sterling Mondo G. Martinique, MD  [facility]. [place] office.

## 2015-12-31 NOTE — Patient Instructions (Signed)
A few things to remember from today's visit:   Essential hypertension - Plan: amLODipine (NORVASC) 2.5 MG tablet  Blood pressure goal for most people is less than 140/90.  Elevated blood pressure increases the risk of strokes, heart and kidney disease, and eye problems. Regular physical activity and a healthy diet (DASH diet) usually help. Low salt diet. Take medications as instructed. Caution with some over the counter medications as cold medications, dietary products (for weight loss), and Ibuprofen or Aleve (frequent use);all these medications could cause elevation of blood pressure.   Please be sure medication list is accurate. If a new problem present, please set up appointment sooner than planned today.

## 2016-02-04 ENCOUNTER — Encounter (INDEPENDENT_AMBULATORY_CARE_PROVIDER_SITE_OTHER): Payer: Self-pay | Admitting: Physical Medicine and Rehabilitation

## 2016-02-04 ENCOUNTER — Ambulatory Visit (INDEPENDENT_AMBULATORY_CARE_PROVIDER_SITE_OTHER): Payer: Medicare Other | Admitting: Physical Medicine and Rehabilitation

## 2016-02-04 ENCOUNTER — Ambulatory Visit (INDEPENDENT_AMBULATORY_CARE_PROVIDER_SITE_OTHER): Payer: Medicare Other

## 2016-02-04 VITALS — BP 167/76 | HR 68

## 2016-02-04 DIAGNOSIS — G8929 Other chronic pain: Secondary | ICD-10-CM

## 2016-02-04 DIAGNOSIS — M461 Sacroiliitis, not elsewhere classified: Secondary | ICD-10-CM

## 2016-02-04 DIAGNOSIS — M5441 Lumbago with sciatica, right side: Secondary | ICD-10-CM

## 2016-02-04 DIAGNOSIS — M5416 Radiculopathy, lumbar region: Secondary | ICD-10-CM | POA: Diagnosis not present

## 2016-02-04 NOTE — Progress Notes (Signed)
MAYAH SCHRADER - 83 y.o. female MRN [id number]  Date of birth: 11-09-1928  Office Visit Note: Visit Date: 02/04/2016 PCP: Betty Martinique, MD Referred by: Martinique, Betty G, MD  Subjective: Chief Complaint  Patient presents with  . Lower Back - Pain   HPI: Mrs. Popham is a pleasant 83 year old female who is here today with her daughter who provides a lot of the history. She does understand English verywell but the daughter does act as interpreter at times in [place]. She reports living in [place] up until this past year. She has established care with a primary care physician Dr. Martinique in those notes we have reviewed. She is also seen Dr. Durward Fortes on her practice for her knees. His notes were reviewed as well. His injections into her knees and provided her with some relief she's happy with that. She was trying to get an appointment with our office that did seem to take quite a while I did explain to her that we have just began using the Epic system and the referral somehow did not get placed into the work cue for our office in particular and this did take a while for that to be discovered. Per the patient and her daughter the patient has had chronic back pain for over 20 years. She has had pain that radiates across the back worse with standing and ambulating better at rest. It does radiate at times in the left hip and thigh and hip and buttock. She reports no real paresthesias down the legs but does have numbness and tingling in the feet bilaterally. She has a hard time explaining the feet paresthesias but it is in somewhat of a stocking distribution. She is not a diabetic. She has not been diagnosed with peripheral polyneuropathy. She has had some balance problems and falls. While in [place] she had received what they tell me his epidural injections that would last 3-4 months at a time. These were done by pain management person there. She does not have those notes for me to review. There is no  recent advanced imaging or imaging of the spine to review. I did take the liberty today to obtain AP and lateral x-rays and is reviewed below. She did bring along a billing document from their practice that showed what was really just a joint injection code some not sure exactly what they did from an epidural standpoint. She has had therapy in the past but not recently. She does try to stay active and mobile. She has not taken much in the way of medications. Dr. Martinique did try gabapentin but the patient did not tolerate. She been intolerant of different pain medications in the past. She states injections helped her quite a bit. She doesn't report any specific trauma but has had some recent falls.     Review of Systems  Constitutional: Negative for chills, fever, malaise/fatigue and weight loss.  HENT: Negative for hearing loss and sinus pain.   Eyes: Negative for blurred vision, double vision and photophobia.  Respiratory: Negative for cough and shortness of breath.   Cardiovascular: Negative for chest pain, palpitations and leg swelling.  Gastrointestinal: Negative for abdominal pain, nausea and vomiting.  Genitourinary: Negative for flank pain.  Musculoskeletal: Positive for back pain, falls and joint pain. Negative for myalgias.  Skin: Negative for itching and rash.  Neurological: Positive for tingling. Negative for tremors, focal weakness and weakness.  Endo/Heme/Allergies: Negative.   Psychiatric/Behavioral: Negative for depression.  All other systems reviewed and  are negative.  Otherwise per HPI.  Assessment & Plan: Visit Diagnoses:  1. Sacroiliitis ([place])   2. Chronic right-sided low back pain with right-sided sciatica   3. Lumbar radiculopathy     Plan: Findings:  Chronic pain syndrome and chronic worsening low back pain some left-sided symptoms it could be radicular. X-ray images show pretty significant scoliosis with degenerative changes of the lower lumbar spine as well as  right-sided sacroiliac joint degenerative change. Most of her pain is centered over the sacroiliac joint. I think the first step would be to complete a diagnostic sacroiliac joint injection with fluoroscopic guidance and see how much that helped her. If that didn't seem to help very much I would go ahead and try to schedule an epidural injection one time. She is not on any blood thinners but had been on Coumadin in the past. Depending on the result of an epidural injection if it was just not lasting very long or did not help we are probably look at updating advanced imaging such as an MRI. He has no red flag symptoms today to warrant an MRI at this point but I would be very quick to get one. We will also try to obtain more notes from her physician in [place]. In terms of her balance difficulties and feet paresthesias she may want to seek consultation with a neurologist. This was discussed a referral was not made. She can also discuss this with her primary care physician Dr. Martinique. I spent more than 25 minutes speaking face-to-face with the patient with 50% of the time in counseling.    Meds & Orders: No orders of the defined types were placed in this encounter.   Orders Placed This Encounter  Procedures  . XR Lumbar Spine Complete    Follow-up: Return for Right Sacroiliac injection with fluoroscopic guidance.   Procedures: No procedures performed  No notes on file   Clinical History: No specialty comments available.  She reports that she has never smoked. She has never used smokeless tobacco. No results for input(s): HGBA1C, LABURIC in the last 8760 hours.  Objective:  VS:  HT:    WT:   BMI:     BP:(!) 167/76  HR:68bpm  TEMP: ( )  RESP:  Physical Exam  Constitutional: She is oriented to person, place, and time. She appears well-developed and well-nourished. No distress.  HENT:  Head: Normocephalic and atraumatic.  Nose: Nose normal.  Mouth/Throat: Oropharynx is clear and moist.    Eyes: Conjunctivae are normal. Pupils are equal, round, and reactive to light.  Neck: Normal range of motion. Neck supple. No tracheal deviation present.  Cardiovascular: Normal rate, regular rhythm and intact distal pulses.   Pulmonary/Chest: Effort normal and breath sounds normal.  Abdominal: She exhibits no distension. There is no tenderness.  Musculoskeletal:  She ambulates with some balance difficulty. She has good distal strength without clonus bilaterally. No pain with hip rotation. She is very stiff in the lumbar spine pain with extension rotation which is concordant for her low back.  Neurological: She is alert and oriented to person, place, and time. She exhibits normal muscle tone. Coordination normal.  Skin: Skin is warm. No rash noted. No erythema.  Psychiatric: She has a normal mood and affect. Her behavior is normal.  Nursing note and vitals reviewed.   Ortho Exam Imaging: No results found.  Past Medical/Family/Surgical/Social History: Medications & Allergies reviewed per EMR Patient Active Problem List   Diagnosis Date Noted  . Chronic pain  disorder 10/29/2015  . Essential hypertension 10/29/2015  . Unstable gait 10/29/2015  . Osteoarthritis of left knee 10/29/2015  . Vertigo 10/29/2015  . Low back pain with radiation, unspecified laterality 10/29/2015   Past Medical History:  Diagnosis Date  . Arthritis   . Diverticulitis   . History of fainting spells of unknown cause   . Hypercholesteremia   . Hypertension   . Pulmonary emboli (HCC)    Family History  Problem Relation Age of Onset  . Diabetes Mother   . Diabetes Father    Past Surgical History:  Procedure Laterality Date  . APPENDECTOMY    . CARPAL TUNNEL RELEASE    . HERNIA REPAIR     mesh removed  . TONSILLECTOMY     Social History   Occupational History  . Not on file.   Social History Main Topics  . Smoking status: Never Smoker  . Smokeless tobacco: Never Used  . Alcohol use No  .  Drug use: No  . Sexual activity: Not Currently

## 2016-02-06 ENCOUNTER — Ambulatory Visit (INDEPENDENT_AMBULATORY_CARE_PROVIDER_SITE_OTHER): Payer: Medicare Other | Admitting: Physical Medicine and Rehabilitation

## 2016-02-10 ENCOUNTER — Ambulatory Visit (INDEPENDENT_AMBULATORY_CARE_PROVIDER_SITE_OTHER): Payer: Medicare Other | Admitting: Physical Medicine and Rehabilitation

## 2016-02-10 VITALS — BP 152/72

## 2016-02-10 DIAGNOSIS — M461 Sacroiliitis, not elsewhere classified: Secondary | ICD-10-CM | POA: Diagnosis not present

## 2016-02-10 NOTE — Patient Instructions (Signed)

## 2016-02-10 NOTE — Progress Notes (Signed)
Deborah Bell - 83 y.o. female MRN [id number]  Date of birth: [birth year]  Office Visit Note: Visit Date: 02/10/2016 PCP: Deborah Martinique, MD Referred by: Bell, Deborah G, MD  Subjective: Chief Complaint  Patient presents with  . Lower Back - Pain   HPI: Deborah Bell is here today for planned right Si joint injection. She's had no change in her symptoms so having right low back and buttock pain.    ROS Otherwise per HPI.  Assessment & Plan: Visit Diagnoses:  1. Sacroiliitis ([place])     Plan: Findings:  Right sacroiliac joint injection with fluoroscopic guidance. We'll schedule patient for potential epidural injection that would be performed if she did not get relief with the sacroiliac joint injection. Please see our prior evaluation and management note for further details and justification.    Meds & Orders: No orders of the defined types were placed in this encounter.   Orders Placed This Encounter  Procedures  . Large Joint Injection/Arthrocentesis    Follow-up: No Follow-up on file.   Procedures: Sacroiliac joint injection with fluoroscopic guidance Date/Time: 02/10/2016 1:30 PM Performed by: Magnus Sinning Authorized by: Magnus Sinning   Consent Given by:  Patient Site marked: the procedure site was marked   Timeout: prior to procedure the correct patient, procedure, and site was verified   Indications:  Pain and diagnostic evaluation Location:  Sacroiliac Site:  R sacroiliac joint Prep: patient was prepped and draped in usual sterile fashion   Needle Size:  22 Bell Needle Length:  3.5 inches Approach:  Posterior Ultrasound Guidance: No   Fluoroscopic Guidance: Yes   Arthrogram: No   Medications:  2 mL bupivacaine 0.5 %; 80 mg methylPREDNISolone acetate 80 MG/ML Aspiration Attempted: No   Patient tolerance:  Patient tolerated the procedure well with no immediate complications  There was excellent flow of contrast producing a partial arthrogram of the  sacroiliac joint.      No notes on file   Clinical History: No specialty comments available.  She reports that she has never smoked. She has never used smokeless tobacco. No results for input(s): HGBA1C, LABURIC in the last 8760 hours.  Objective:  VS:  HT:    WT:   BMI:     BP:(!) 152/72  HR: bpm  TEMP: ( )  RESP:  Physical Exam  Musculoskeletal:  Patient has positive Fortin finger sign on the right. She has good distal strength.    Ortho Exam Imaging: No results found.  Past Medical/Family/Surgical/Social History: Medications & Allergies reviewed per EMR Patient Active Problem List   Diagnosis Date Noted  . Chronic pain disorder 10/29/2015  . Essential hypertension 10/29/2015  . Unstable gait 10/29/2015  . Osteoarthritis of left knee 10/29/2015  . Vertigo 10/29/2015  . Low back pain with radiation, unspecified laterality 10/29/2015   Past Medical History:  Diagnosis Date  . Arthritis   . Diverticulitis   . History of fainting spells of unknown cause   . Hypercholesteremia   . Hypertension   . Pulmonary emboli (H[facility])    Family History  Problem Relation Age of Onset  . Diabetes Mother   . Diabetes Father    Past Surgical History:  Procedure Laterality Date  . APPENDECTOMY    . CARPAL TUNNEL RELEASE    . HERNIA REPAIR     mesh removed  . TONSILLECTOMY     Social History   Occupational History  . Not on file.   Social History Main Topics  .  Smoking status: Never Smoker  . Smokeless tobacco: Never Used  . Alcohol use No  . Drug use: No  . Sexual activity: Not Currently

## 2016-02-11 MED ORDER — BUPIVACAINE HCL 0.5 % IJ SOLN
2.0000 mL | INTRAMUSCULAR | Status: AC | PRN
  Administered 2016-02-10: 2 mL via INTRA_ARTICULAR

## 2016-02-11 MED ORDER — METHYLPREDNISOLONE ACETATE 80 MG/ML IJ SUSP
80.0000 mg | INTRAMUSCULAR | Status: AC | PRN
  Administered 2016-02-10: 80 mg via INTRA_ARTICULAR

## 2016-02-24 ENCOUNTER — Encounter (INDEPENDENT_AMBULATORY_CARE_PROVIDER_SITE_OTHER): Payer: Medicare Other | Admitting: Physical Medicine and Rehabilitation

## 2016-03-13 ENCOUNTER — Telehealth: Payer: Self-pay | Admitting: *Deleted

## 2016-03-13 NOTE — Telephone Encounter (Signed)
Patient refused Euflexxa due to cost.

## 2016-03-27 ENCOUNTER — Telehealth: Payer: Self-pay | Admitting: Family Medicine

## 2016-03-27 MED ORDER — SIMVASTATIN 20 MG PO TABS: 20.0000 mg | ORAL_TABLET | Freq: Every day | ORAL | 1 refills | Status: DC

## 2016-03-27 NOTE — Telephone Encounter (Signed)
Patient needs a refill for Simvastatin.  She has been out for 2 days and needs to try to get something called in before the weekend.   Pharmacy: [facility] Oppen[facility] at [facility]

## 2016-03-27 NOTE — Telephone Encounter (Signed)
Rx sent 

## 2016-04-02 ENCOUNTER — Telehealth (INDEPENDENT_AMBULATORY_CARE_PROVIDER_SITE_OTHER): Payer: Self-pay | Admitting: Physical Medicine and Rehabilitation

## 2016-04-03 NOTE — Telephone Encounter (Signed)
Reschedule the right L5-S1.

## 2016-04-03 NOTE — Telephone Encounter (Signed)
Scheduled for 04/14/16 at 1600.

## 2016-04-14 ENCOUNTER — Encounter (INDEPENDENT_AMBULATORY_CARE_PROVIDER_SITE_OTHER): Payer: Self-pay | Admitting: Physical Medicine and Rehabilitation

## 2016-04-14 ENCOUNTER — Ambulatory Visit (INDEPENDENT_AMBULATORY_CARE_PROVIDER_SITE_OTHER): Payer: Self-pay

## 2016-04-14 ENCOUNTER — Ambulatory Visit (INDEPENDENT_AMBULATORY_CARE_PROVIDER_SITE_OTHER): Payer: Medicare Other | Admitting: Physical Medicine and Rehabilitation

## 2016-04-14 VITALS — BP 155/80 | HR 76

## 2016-04-14 DIAGNOSIS — M5416 Radiculopathy, lumbar region: Secondary | ICD-10-CM | POA: Diagnosis not present

## 2016-04-14 MED ORDER — METHYLPREDNISOLONE ACETATE 80 MG/ML IJ SUSP
80.0000 mg | Freq: Once | INTRAMUSCULAR | Status: AC
  Administered 2016-04-14: 80 mg

## 2016-04-14 MED ORDER — LIDOCAINE HCL (PF) 1 % IJ SOLN
0.3300 mL | Freq: Once | INTRAMUSCULAR | Status: AC
  Administered 2016-04-14: 0.3 mL

## 2016-04-14 NOTE — Patient Instructions (Signed)

## 2016-04-14 NOTE — Progress Notes (Signed)
Deborah Bell - 83 y.o. female MRN [id number]  Date of birth: May 28, 1928  Office Visit Note: Visit Date: 04/14/2016 PCP: Betty Martinique, MD Referred by: Martinique, Betty G, MD  Subjective: Chief Complaint  Patient presents with  . Lower Back - Pain   HPI: Mrs. Deborah Bell is an 83 year old female accompanied by her daughter who provides some translation of those. She does speak [place] fairly well. We saw her at the request of Dr. Durward Fortes for sacroiliac joint injection which did not seem to help very much. She states that she did get some relief for about a month but it was not significant relief. We have asked the patient to return for possible epidural injection thinking this may be more related to possible stenosis or lateral recess narrowing based on the x-ray imaging. She did not return at that time but has now returned and it's more axial low back pain. Pain across low back. Right=left. Constant. Worse with standing. She states that it still more buttock pain at times but worse across the back. The plan today was epidural injection I think that still a valid injection diagnostically. If she doesn't get much relief with this I think we should look at an MRI imaging of the lumbar spine if she has mostly facet arthropathy that would be another avenue to look at.    ROS Otherwise per HPI.  Assessment & Plan: Visit Diagnoses:  1. Lumbar radiculopathy     Plan: Findings:  Right L5-S1 intralaminar epidural steroid injection. Depending on her relief would look at an MRI of the lumbar spine. Would also look at regrouping with physical therapy. She also to follow up with Dr. Durward Fortes as needed.    Meds & Orders:  Meds ordered this encounter  Medications  . lidocaine (PF) (XYLOCAINE) 1 % injection 0.3 mL  . methylPREDNISolone acetate (DEPO-MEDROL) injection 80 mg    Orders Placed This Encounter  Procedures  . XR C-ARM NO REPORT  . Epidural Steroid injection    Follow-up: Return if  symptoms worsen or fail to improve.   Procedures: No procedures performed  Lumbar Epidural Steroid Injection - Interlaminar Approach with Fluoroscopic Guidance  Patient: Deborah Bell      Date of Birth: 1928-05-01 MRN: [id number] PCP: Betty Martinique, MD      Visit Date: 04/14/2016   Universal Protocol:    Date/Time: 03/29/186:13 AM  Consent Given By: the patient  Position: PRONE  Additional Comments: Vital signs were monitored before and after the procedure. Patient was prepped and draped in the usual sterile fashion. The correct patient, procedure, and site was verified.   Injection Procedure Details:  Procedure Site One Meds Administered:  Meds ordered this encounter  Medications  . lidocaine (PF) (XYLOCAINE) 1 % injection 0.3 mL  . methylPREDNISolone acetate (DEPO-MEDROL) injection 80 mg     Laterality: Right  Location/Site:  L5-S1  Needle size: 20 G  Needle type: Tuohy  Needle Placement: Paramedian epidural  Findings:  -Contrast Used: 1 mL iohexol 180 mg iodine/mL   -Comments: Excellent flow of contrast into the epidural space.  Procedure Details: Using a paramedian approach from the side mentioned above, the region overlying the inferior lamina was localized under fluoroscopic visualization and the soft tissues overlying this structure were infiltrated with 4 ml. of 1% Lidocaine without Epinephrine. The Tuohy needle was inserted into the epidural space using a paramedian approach.   The epidural space was localized using loss of resistance along with lateral and bi-planar  fluoroscopic views.  After negative aspirate for air, blood, and CSF, a 2 ml. volume of Isovue-250 was injected into the epidural space and the flow of contrast was observed. Radiographs were obtained for documentation purposes.    The injectate was administered into the level noted above.   Additional Comments:  The patient tolerated the procedure well Dressing: Band-Aid     Post-procedure details: Patient was observed during the procedure. Post-procedure instructions were reviewed.  Patient left the clinic in stable condition.    Clinical History: No specialty comments available.  She reports that she has never smoked. She has never used smokeless tobacco. No results for input(s): HGBA1C, LABURIC in the last 8760 hours.  Objective:  VS:  HT:    WT:   BMI:     BP:(!) 155/80  HR:76bpm  TEMP: ( )  RESP:96 % Physical Exam  Musculoskeletal:  Patient ambulates without aid. She is stiff lumbar spine pain with extension. Good distal strength.    Ortho Exam Imaging: No results found.  Past Medical/Family/Surgical/Social History: Medications & Allergies reviewed per EMR Patient Active Problem List   Diagnosis Date Noted  . Chronic pain disorder 10/29/2015  . Essential hypertension 10/29/2015  . Unstable gait 10/29/2015  . Osteoarthritis of left knee 10/29/2015  . Vertigo 10/29/2015  . Low back pain with radiation, unspecified laterality 10/29/2015   Past Medical History:  Diagnosis Date  . Arthritis   . Diverticulitis   . History of fainting spells of unknown cause   . Hypercholesteremia   . Hypertension   . Pulmonary emboli (HCC)    Family History  Problem Relation Age of Onset  . Diabetes Mother   . Diabetes Father    Past Surgical History:  Procedure Laterality Date  . APPENDECTOMY    . CARPAL TUNNEL RELEASE    . HERNIA REPAIR     mesh removed  . TONSILLECTOMY     Social History   Occupational History  . Not on file.   Social History Main Topics  . Smoking status: Never Smoker  . Smokeless tobacco: Never Used  . Alcohol use No  . Drug use: No  . Sexual activity: Not Currently

## 2016-04-16 NOTE — Procedures (Signed)
Lumbar Epidural Steroid Injection - Interlaminar Approach with Fluoroscopic Guidance  Patient: Deborah Bell      Date of Birth: [birth year] MRN: [id number] PCP: Betty Martinique, MD      Visit Date: 04/14/2016   Universal Protocol:    Date/Time: 03/29/186:13 AM  Consent Given By: the patient  Position: PRONE  Additional Comments: Vital signs were monitored before and after the procedure. Patient was prepped and draped in the usual sterile fashion. The correct patient, procedure, and site was verified.   Injection Procedure Details:  Procedure Site One Meds Administered:  Meds ordered this encounter  Medications  . lidocaine (PF) (XYLOCAINE) 1 % injection 0.3 mL  . methylPREDNISolone acetate (DEPO-MEDROL) injection 80 mg     Laterality: Right  Location/Site:  L5-S1  Needle size: 20 G  Needle type: Tuohy  Needle Placement: Paramedian epidural  Findings:  -Contrast Used: 1 mL iohexol 180 mg iodine/mL   -Comments: Excellent flow of contrast into the epidural space.  Procedure Details: Using a paramedian approach from the side mentioned above, the region overlying the inferior lamina was localized under fluoroscopic visualization and the soft tissues overlying this structure were infiltrated with 4 ml. of 1% Lidocaine without Epinephrine. The Tuohy needle was inserted into the epidural space using a paramedian approach.   The epidural space was localized using loss of resistance along with lateral and bi-planar fluoroscopic views.  After negative aspirate for air, blood, and CSF, a 2 ml. volume of Isovue-250 was injected into the epidural space and the flow of contrast was observed. Radiographs were obtained for documentation purposes.    The injectate was administered into the level noted above.   Additional Comments:  The patient tolerated the procedure well Dressing: Band-Aid    Post-procedure details: Patient was observed during the procedure. Post-procedure  instructions were reviewed.  Patient left the clinic in stable condition.

## 2016-05-07 DIAGNOSIS — E785 Hyperlipidemia, unspecified: Secondary | ICD-10-CM | POA: Insufficient documentation

## 2016-05-07 NOTE — Progress Notes (Signed)
Deborah Bell is a 83 y.o.female, who is here today with her daughter to follow on HTN and HLD.  Currently she is on Amlodipine 2.5 mg daily. She is taking medications as instructed, no side effects reported. BP at home 150/? Occasionally but most of the time < 140/90. .  She denies headache, visual changes, exertional chest pain, dyspnea,  focal weakness, or edema. Last eye exam 2 years ago.   Lab Results  Component Value Date   CREATININE 0.92 10/26/2015   BUN 17 10/26/2015   NA 142 10/26/2015   K 3.6 10/29/2015   CL 109 10/26/2015   CO2 27 10/26/2015     Hyperlipidemia:  Currently on Zocor 20 mg daily. Following a low fat diet: Yes.  She has not noted side effects with medication.   Hx of lower back pain with right radiculopathy. She is following with Dr Ernestina Patches, according to pt and daughter, she has received  hip injections, which have help with leg pain. She did not tolerate Gabapentin.  Dr Durward Fortes, ortho , for knee OA. Intra articular steroid injections as needed have helped. She follows as needed, left knee pain started hurting "bad" again a few days ago. Pain is exacerbated by walking,standing and alleviated by rest.  Daughter is her caregiver,she is upset because FMLA filled out a few months ago was not allowing her to leave work as needed to stay home when Deborah Bell was not feeling well. Also to cover for visits he has with other providers. She states that her schedule is flexible and late pm appts are better for her.   Review of Systems  Constitutional: Negative for appetite change, fatigue and fever.  HENT: Negative for mouth sores, nosebleeds and trouble swallowing.   Eyes: Negative for redness and visual disturbance.  Respiratory: Negative for cough, shortness of breath and wheezing.   Cardiovascular: Negative for chest pain, palpitations and leg swelling.  Gastrointestinal: Negative for abdominal pain, nausea and vomiting.       Negative  for changes in bowel habits.  Genitourinary: Negative for decreased urine volume and hematuria.  Musculoskeletal: Positive for arthralgias and gait problem. Negative for myalgias.  Skin: Negative for rash.  Neurological: Negative for syncope, weakness and headaches.  Psychiatric/Behavioral: Negative for confusion. The patient is not nervous/anxious.      Current Outpatient Prescriptions on File Prior to Visit  Medication Sig Dispense Refill  . amLODipine (NORVASC) 2.5 MG tablet Take 1 tablet (2.5 mg total) by mouth daily. 90 tablet 1  . CALCIUM PO Take 1 tablet by mouth daily.    . simvastatin (ZOCOR) 20 MG tablet Take 1 tablet (20 mg total) by mouth daily. 90 tablet 1   No current facility-administered medications on file prior to visit.      Past Medical History:  Diagnosis Date  . Arthritis   . Diverticulitis   . History of fainting spells of unknown cause   . Hypercholesteremia   . Hypertension   . Pulmonary emboli (HCC)     No Known Allergies  Social History   Social History  . Marital status: Legally Separated    Spouse name: N/A  . Number of children: N/A  . Years of education: N/A   Social History Main Topics  . Smoking status: Never Smoker  . Smokeless tobacco: Never Used  . Alcohol use No  . Drug use: No  . Sexual activity: Not Currently   Other Topics Concern  . None  Social History Narrative  . None    Vitals:   05/08/16 1434  BP: (!) 150/80  Pulse: 75  Resp: 12   Body mass index is 30.3 kg/m. Wt Readings from Last 3 Encounters:  05/08/16 140 lb (63.5 kg)  12/31/15 139 lb (63 kg)  12/27/15 130 lb (59 kg)     Physical Exam  Nursing note and vitals reviewed. Constitutional: She is oriented to person, place, and time. She appears well-developed. No distress.  HENT:  Head: Atraumatic.  Mouth/Throat: Oropharynx is clear and moist and mucous membranes are normal. She has dentures.  Eyes: Conjunctivae and EOM are normal.    Cardiovascular: Normal rate and regular rhythm.   Murmur (SEM I-II/VI RUSB and LUSB) heard. Pulses:      Dorsalis pedis pulses are 2+ on the right side, and 2+ on the left side.  Respiratory: Effort normal and breath sounds normal. No respiratory distress.  GI: Soft. There is no hepatomegaly. There is no tenderness. A hernia is present.    LLQ superficial mass palpated,no tender, some borders defined, reducible.  Musculoskeletal: She exhibits no edema.  Left knee pain with ROM, moderate crepitus. Antalgic gait.  Lymphadenopathy:    She has no cervical adenopathy.  Neurological: She is alert and oriented to person, place, and time. She displays no tremor. No cranial nerve deficit.  No focal deficit. stable gait, assisted with cane.  Skin: Skin is warm. No erythema.  Psychiatric: She has a normal mood and affect.  Well groomed, good eye contact.    ASSESSMENT AND PLAN:   Deborah Bell was seen today for medicare wellness.  Diagnoses and all orders for this visit:  Essential hypertension  Elevated today, re-check 155/80. No changes in current management for now. Instructed to continue monitoring BP and to bring readings to next OV.  DASH-low salt diet recommended. Eye exam recommended annually. F/U in 5-6 months, before if needed.  -     Comprehensive metabolic panel; Future  Hyperlipidemia, unspecified hyperlipidemia type  She is not fasting today ,sp future labs placed to be done next week. No changes in current management, will follow labs and will give further recommendations accordingly. We discussed risk of side effects with Zocor and Amlodipine,now she is on low dose but if we need to increase either one we need to consider changing statin or antihypertensive meds. F/U in 6-12 months.  -     Comprehensive metabolic panel; Future -     Lipid panel; Future  Low back pain with radiation, unspecified laterality  Improved. Continue following with Dr Ernestina Patches.  Obesity,  Class I, BMI 30-34.9  We discussed benefits of wt loss as well as adverse effects of obesity. Consistency with healthy diet and physical activity. Low impact exercise recommended.   Primary osteoarthritis of left knee  Intra articular injections have helped. Daughter is planning on arranging another appt since knee started hurting. Fall prevention discussed.  Incisional hernia of anterior abdominal wall without obstruction or gangrene  Reducible,no tender. She had hernia repair in 0932 complicated with infection, mesh had to be removed. Instructed about warning signs.   In regard to FMLA explained I will be glad to fill another one including appts to dentists (2/year),eye exam (2/year) ,labs (2/year) ,and visits with me (2/year). I cannot fill out one that allows her to leave work as a needed basis. She may also want to request one from providers she is visiting more often.    -Deborah. Syla L Hector was advised  to return sooner than planned today if new concerns arise.     Betty G. Martinique, MD  [facility]. [place] office.

## 2016-05-08 ENCOUNTER — Ambulatory Visit (INDEPENDENT_AMBULATORY_CARE_PROVIDER_SITE_OTHER): Payer: Medicare Other | Admitting: Family Medicine

## 2016-05-08 ENCOUNTER — Encounter: Payer: Self-pay | Admitting: Family Medicine

## 2016-05-08 VITALS — BP 150/80 | HR 75 | Resp 12 | Ht <= 58 in | Wt 140.0 lb

## 2016-05-08 DIAGNOSIS — M1712 Unilateral primary osteoarthritis, left knee: Secondary | ICD-10-CM

## 2016-05-08 DIAGNOSIS — K432 Incisional hernia without obstruction or gangrene: Secondary | ICD-10-CM | POA: Insufficient documentation

## 2016-05-08 DIAGNOSIS — I10 Essential (primary) hypertension: Secondary | ICD-10-CM | POA: Diagnosis not present

## 2016-05-08 DIAGNOSIS — M545 Low back pain, unspecified: Secondary | ICD-10-CM

## 2016-05-08 DIAGNOSIS — E785 Hyperlipidemia, unspecified: Secondary | ICD-10-CM

## 2016-05-08 DIAGNOSIS — M5440 Lumbago with sciatica, unspecified side: Secondary | ICD-10-CM

## 2016-05-08 DIAGNOSIS — E669 Obesity, unspecified: Secondary | ICD-10-CM

## 2016-05-08 NOTE — Progress Notes (Signed)
Subjective:   Deborah Bell is a 83 y.o. female who presents for Medicare Annual (Subsequent) preventive examination.  HRA assessment completed during this visit with Deborah Bell  The Patient was informed that the wellness visit is to identify future health risk and educate and initiate measures that can reduce risk for increased disease through the lifespan.    NO ROS; Medicare Wellness Visit Last OV:  is today  Labs completed: 10/2015 Diabetes; bs 111  Describes health as fair, good or great? Medical hx; PE; Diabetes  Psychosocial Lives with dtr Lives in ranch home  Bathtub with shower;  Has shower seat  Preventive Health Colonoscopy; presumed aged out Mammogram: waive it now;  Dexa/ states she has hx of osteoporosis per her admission.   Immunizations Due: (Vaccines reviewed and educated regarding any overdue)  PSV 23 per the record; will check as it is not noted which one she had; dtr here today and does not know. Will wait on list of vaccines at home  Tdap- will bring list of vaccines Has a card at home and will check to confirm when she had her immunizations    Update:  Tobacco: never smoked  Etoh; none  Medications no issues   BMI: 30  Had coffee and oatmeal Sometimes she eats lunch or drinks protein shake Has a good dinner;  Eats vegetables and fruits    Diet;  acid reflux / complains of occasional pain in left chest area  Nutritional counseling given: to eat vegetables and fruits   Dental; planning to take her to review for dentures Was done x 10 years ago; dtr trying to secure apt for her with Medicaid   Exercise;  has cycling machine at home Keeps exercise every day; 30 minutes;   HOME SAFETY; lives with dtr  Fall hx; fell in [place]; fainted from PE   Gait: walks with cane due to degenerative OA; had had injections to her hip; Now she is waiting for apt to get injection with knee  Given education on "Fall Prevention in the Home" for more  safety tips the patient can apply as appropriate.   Safety features reviewed for safe community;  Both the patient and her BF mother lives in the home  firearms if in the home;  smoke alarms; yes sun protection when outside no areas of concern  driving difficulties or accidents - no   Mental Health:  Any emotional problems? Anxious, depressed, irritable, sad or blue? No  Denies feeling depressed or hopeless; voices pleasure in daily life How many social activities have you been engaged in within the last 2 weeks? No Outreach from lots of family in [place]   When she sits down a long time she hurts  Cognitive;  Manages checkbook, medications; no failures of task Ad8 score reviewed for issues;  Issues making decisions; no  Less interest in hobbies / activities" no  Repeats questions, stories; family complaining: NO  Trouble using ordinary gadgets; microwave; computer: no  Forgets the month or year: no  Mismanaging finances: no  Missing apt: no but does write them down  Daily problems with thinking of memory NO Ad8 score is 0 no memory issues interfering with life   Any dizziness when standing up? No   Mobilization and Functional losses from last year to this year? Some; uses a cane Some but doing all her ADLs  Sleep pattern changes; sleeps well   Hearing:  Right ear ;  Left ear is hurting; MD  to check today  Difficulty hearing a whisper no   Vision checks;  Generally every year Does not have an eye doctor here Dtr will make apt with her eye doctor  Advanced Directive addressed;       Objective:     Vitals: BP (!) 150/80   Pulse 75   Ht 4\' 9"  (1.448 m)   Wt 140 lb (63.5 kg)   SpO2 97%   BMI 30.30 kg/m   Body mass index is 30.3 kg/m.   Tobacco History  Smoking Status  . Never Smoker  Smokeless Tobacco  . Never Used     Counseling given: Not Answered   Past Medical History:  Diagnosis Date  . Arthritis   . Diverticulitis   . History of  fainting spells of unknown cause   . Hypercholesteremia   . Hypertension   . Pulmonary emboli [facility])    Past Surgical History:  Procedure Laterality Date  . APPENDECTOMY    . CARPAL TUNNEL RELEASE    . HERNIA REPAIR     mesh removed  . TONSILLECTOMY     Family History  Problem Relation Age of Onset  . Diabetes Mother   . Diabetes Father    History  Sexual Activity  . Sexual activity: Not Currently    Outpatient Encounter Prescriptions as of 05/08/2016  Medication Sig  . amLODipine (NORVASC) 2.5 MG tablet Take 1 tablet (2.5 mg total) by mouth daily.  [name] CALCIUM PO Take 1 tablet by mouth daily.  . simvastatin (ZOCOR) 20 MG tablet Take 1 tablet (20 mg total) by mouth daily.   No facility-administered encounter medications on file as of 05/08/2016.     Activities of Daily Living No flowsheet data found.  Patient Care Team: Betty G Martinique, MD as PCP - General (Family Medicine)    Assessment:     Exercise Activities and Dietary recommendations    Goals    . patient           Deborah Bell , Thank you for taking time to come for your Medicare Wellness Visit. I appreciate your ongoing commitment to your health goals. Please review the following plan we discussed and let me know if I can assist you in the future.   Plan an eye visit     These are the goals we discussed: Goals    None      This is a list of the screening recommended for you and due dates:  Health Maintenance  Topic Date Due  . Tetanus Vaccine  08/01/1947  . DEXA scan (bone density measurement)  08/02/1995  . Pneumonia vaccines (2 of 2 - PPSV23) 11/26/2014  . Flu Shot  08/19/2016      May enjoy the [place]  Diabetes and weight loss; [facility]  Phone: [phone]   [place] [facility]; [phone] [address] [place]; [phone] Get resource to get information on any and all community programs for Seniors  [place]: 9417262306 Community Health  Response Program -[phone] Public Health Dept; Need to be a skilled visit but can assist with bathing as well; [phone]  Adult center for Enrichment;  Call Senior Line; [phone]  Adult day services include Adult Day Care, Adult Day Healthcare, Group Respite, Care Partners, Volunteer In [employer], Education and Support Program   Dept of Social Services; Call [phone] and ask for SW on call  Options for Medicaid include the Community Alternatives program; [place] (personal care services) or PACE program,  which is a medical and social program combined  Braulio Conte manages the community Alternatives program at the [employer]; [phone] (this is a program with a waiting list but provides SNF care at home;  [facility] 2 required; Call Claiborne Billings and she will send out packet of information   Caregiver support group and information regarding [place] is at the; [facility] Address: [address], [place], [place] [address]  Phone: [phone]     [facility] [place] - can assist with hearing aid x 1  No reviews  [facility]  [place]  [phone]         Fall Risk No flowsheet data found. Depression Screen No flowsheet data found.   Cognitive Function        Immunization History  Administered Date(s) Administered  . Influenza, High Dose Seasonal PF 10/29/2015   Screening Tests Health Maintenance  Topic Date Due  . TETANUS/TDAP  08/01/1947  . DEXA SCAN  08/02/1995  . PNA vac Low Risk Adult (2 of 2 - PPSV23) 11/26/2014  . INFLUENZA VACCINE  08/19/2016      Plan:      PCP Notes  Health Maintenance Will postpone 2nd pneumonia and tetanus until dtr finds list of vaccines at home and we can document Will also try to get dexa report  Will schedule a vision check  Abnormal Screens   Referrals given community resources as she did like to do crafts at home.    Patient concerns; awaiting injection to left knee Left ear needs to be checked   Nurse Concerns; vague pain in left upper chest; no other issues; states it may be reflux;   Next PCP apt today   During the course of the visit the patient was educated and counseled about the following appropriate screening and preventive services:   Vaccines to include Pneumoccal, Influenza, Hepatitis B, Td, Zostavax, HCV  Electrocardiogram  Cardiovascular Disease  Colorectal cancer screening  Bone density screening  Diabetes screening  Glaucoma screening  Mammography/PAP  Nutrition counseling   Patient Instructions (the written plan) was given to the patient.   VCBSW,HQPRF, RN  05/08/2016   I have reviewed documentation from this visit and I agree with recommendations given.  Betty G. Martinique, MD  [facility]. [place] office.

## 2016-05-08 NOTE — Patient Instructions (Addendum)
Deborah Bell , Thank you for taking time to come for your Medicare Wellness Visit. I appreciate your ongoing commitment to your health goals. Please review the following plan we discussed and let me know if I can assist you in the future.   Will get an eye exam   Will check at home for a list of vaccines; Pneumonia and Tdap   Also will check the last report of her bone density    These are the goals we discussed: Goals    . patient           Deborah Bell , Thank you for taking time to come for your Medicare Wellness Visit. I appreciate your ongoing commitment to your health goals. Please review the following plan we discussed and let me know if I can assist you in the future.   Plan an eye visit     These are the goals we discussed: Goals    None      This is a list of the screening recommended for you and due dates:  Health Maintenance  Topic Date Due  . Tetanus Vaccine  08/01/1947  . DEXA scan (bone density measurement)  08/02/1995  . Pneumonia vaccines (2 of 2 - PPSV23) 11/26/2014  . Flu Shot  08/19/2016      May enjoy the [place]  Diabetes and weight loss; [facility]  Phone: [phone]   [place] [facility]; ([phone] [address] [place]; 747-451-6444 Get resource to get information on any and all community programs for Seniors  [place]: ([phone] Community Health Response Program -081-448-1856 Public Health Dept; Need to be a skilled visit but can assist with bathing as well; [phone]  Adult center for Enrichment;  Call Senior Line; [phone]  Adult day services include Adult Day Care, Adult Day Healthcare, Group Respite, Care Partners, Volunteer In [employer], Education and Support Program   Dept of Social Services; Call [phone] and ask for SW on call  Options for Medicaid include the Community Alternatives program; [place] (personal care services) or PACE program, which is a medical and  social program combined  Braulio Conte manages the community Alternatives program at the [employer]; 094-709-6283 (this is a program with a waiting list but provides SNF care at home;  [facility] 2 required; Call Claiborne Billings and she will send out packet of information   Caregiver support group and information regarding [place] is at the; [facility] Address: [address] [place], [place] [address]  Phone: [phone]     Deaf & Hard of Hearing Division Services - can assist with hearing aid x 1  No reviews  [employer] Office  [address]  [phone]          This is a list of the screening recommended for you and due dates:  Health Maintenance  Topic Date Due  . Tetanus Vaccine  08/01/1947  . DEXA scan (bone density measurement)  08/02/1995  . Pneumonia vaccines (2 of 2 - PPSV23) 11/26/2014  . Flu Shot  08/19/2016   Health Maintenance, Female Adopting a healthy lifestyle and getting preventive care can go a long way to promote health and wellness. Talk with your health care provider about what schedule of regular examinations is right for you. This is a good chance for you to check in with your provider about disease prevention and staying healthy. In between checkups, there are plenty of things you can  do on your own. Experts have done a lot of research about which lifestyle changes and preventive measures are most likely to keep you healthy. Ask your health care provider for more information. Weight and diet Eat a healthy diet  Be sure to include plenty of vegetables, fruits, low-fat dairy products, and lean protein.  Do not eat a lot of foods high in solid fats, added sugars, or salt.  Get regular exercise. This is one of the most important things you can do for your health.  Most adults should exercise for at least 150 minutes each week. The exercise should increase your heart rate and make you sweat (moderate-intensity  exercise).  Most adults should also do strengthening exercises at least twice a week. This is in addition to the moderate-intensity exercise. Maintain a healthy weight  Body mass index (BMI) is a measurement that can be used to identify possible weight problems. It estimates body fat based on height and weight. Your health care provider can help determine your BMI and help you achieve or maintain a healthy weight.  For females 56 years of age and older:  A BMI below 18.5 is considered underweight.  A BMI of 18.5 to 24.9 is normal.  A BMI of 25 to 29.9 is considered overweight.  A BMI of 30 and above is considered obese. Watch levels of cholesterol and blood lipids  You should start having your blood tested for lipids and cholesterol at 81 years of age, then have this test every 5 years.  You may need to have your cholesterol levels checked more often if:  Your lipid or cholesterol levels are high.  You are older than 81 years of age.  You are at high risk for heart disease. Cancer screening Lung Cancer  Lung cancer screening is recommended for adults 75-7 years old who are at high risk for lung cancer because of a history of smoking.  A yearly low-dose CT scan of the lungs is recommended for people who:  Currently smoke.  Have quit within the past 15 years.  Have at least a 30-pack-year history of smoking. A pack year is smoking an average of one pack of cigarettes a day for 1 year.  Yearly screening should continue until it has been 15 years since you quit.  Yearly screening should stop if you develop a health problem that would prevent you from having lung cancer treatment. Breast Cancer  Practice breast self-awareness. This means understanding how your breasts normally appear and feel.  It also means doing regular breast self-exams. Let your health care provider know about any changes, no matter how small.  If you are in your 20s or 30s, you should have a clinical  breast exam (CBE) by a health care provider every 1-3 years as part of a regular health exam.  If you are 75 or older, have a CBE every year. Also consider having a breast X-ray (mammogram) every year.  If you have a family history of breast cancer, talk to your health care provider about genetic screening.  If you are at high risk for breast cancer, talk to your health care provider about having an MRI and a mammogram every year.  Breast cancer gene (BRCA) assessment is recommended for women who have family members with BRCA-related cancers. BRCA-related cancers include:  Breast.  Ovarian.  Tubal.  Peritoneal cancers.  Results of the assessment will determine the need for genetic counseling and BRCA1 and BRCA2 testing. Cervical Cancer  Your health  care provider may recommend that you be screened regularly for cancer of the pelvic organs (ovaries, uterus, and vagina). This screening involves a pelvic examination, including checking for microscopic changes to the surface of your cervix (Pap test). You may be encouraged to have this screening done every 3 years, beginning at age 46.  For women ages 35-65, health care providers may recommend pelvic exams and Pap testing every 3 years, or they may recommend the Pap and pelvic exam, combined with testing for human papilloma virus (HPV), every 5 years. Some types of HPV increase your risk of cervical cancer. Testing for HPV may also be done on women of any age with unclear Pap test results.  Other health care providers may not recommend any screening for nonpregnant women who are considered low risk for pelvic cancer and who do not have symptoms. Ask your health care provider if a screening pelvic exam is right for you.  If you have had past treatment for cervical cancer or a condition that could lead to cancer, you need Pap tests and screening for cancer for at least 20 years after your treatment. If Pap tests have been discontinued, your risk  factors (such as having a new sexual partner) need to be reassessed to determine if screening should resume. Some women have medical problems that increase the chance of getting cervical cancer. In these cases, your health care provider may recommend more frequent screening and Pap tests. Colorectal Cancer  This type of cancer can be detected and often prevented.  Routine colorectal cancer screening usually begins at 81 years of age and continues through 81 years of age.  Your health care provider may recommend screening at an earlier age if you have risk factors for colon cancer.  Your health care provider may also recommend using home test kits to check for hidden blood in the stool.  A small camera at the end of a tube can be used to examine your colon directly (sigmoidoscopy or colonoscopy). This is done to check for the earliest forms of colorectal cancer.  Routine screening usually begins at age 18.  Direct examination of the colon should be repeated every 5-10 years through 81 years of age. However, you may need to be screened more often if early forms of precancerous polyps or small growths are found. Skin Cancer  Check your skin from head to toe regularly.  Tell your health care provider about any new moles or changes in moles, especially if there is a change in a mole's shape or color.  Also tell your health care provider if you have a mole that is larger than the size of a pencil eraser.  Always use sunscreen. Apply sunscreen liberally and repeatedly throughout the day.  Protect yourself by wearing long sleeves, pants, a wide-brimmed hat, and sunglasses whenever you are outside. Heart disease, diabetes, and high blood pressure  High blood pressure causes heart disease and increases the risk of stroke. High blood pressure is more likely to develop in:  People who have blood pressure in the high end of the normal range (130-139/85-89 mm Hg).  People who are overweight or  obese.  People who are African American.  If you are 61-43 years of age, have your blood pressure checked every 3-5 years. If you are 77 years of age or older, have your blood pressure checked every year. You should have your blood pressure measured twice-once when you are at a hospital or clinic, and once when you are  not at a hospital or clinic. Record the average of the two measurements. To check your blood pressure when you are not at a hospital or clinic, you can use:  An automated blood pressure machine at a pharmacy.  A home blood pressure monitor.  If you are between 67 years and 55 years old, ask your health care provider if you should take aspirin to prevent strokes.  Have regular diabetes screenings. This involves taking a blood sample to check your fasting blood sugar level.  If you are at a normal weight and have a low risk for diabetes, have this test once every three years after 81 years of age.  If you are overweight and have a high risk for diabetes, consider being tested at a younger age or more often. Preventing infection Hepatitis B  If you have a higher risk for hepatitis B, you should be screened for this virus. You are considered at high risk for hepatitis B if:  You were born in a country where hepatitis B is common. Ask your health care provider which countries are considered high risk.  Your parents were born in a high-risk country, and you have not been immunized against hepatitis B (hepatitis B vaccine).  You have HIV or AIDS.  You use needles to inject street drugs.  You live with someone who has hepatitis B.  You have had sex with someone who has hepatitis B.  You get hemodialysis treatment.  You take certain medicines for conditions, including cancer, organ transplantation, and autoimmune conditions. Hepatitis C  Blood testing is recommended for:  Everyone born from [birth year] through 1963.  Anyone with known risk factors for hepatitis C. Sexually  transmitted infections (STIs)  You should be screened for sexually transmitted infections (STIs) including gonorrhea and chlamydia if:  You are sexually active and are younger than 81 years of age.  You are older than 81 years of age and your health care provider tells you that you are at risk for this type of infection.  Your sexual activity has changed since you were last screened and you are at an increased risk for chlamydia or gonorrhea. Ask your health care provider if you are at risk.  If you do not have HIV, but are at risk, it may be recommended that you take a prescription medicine daily to prevent HIV infection. This is called pre-exposure prophylaxis (PrEP). You are considered at risk if:  You are sexually active and do not regularly use condoms or know the HIV status of your partner(s).  You take drugs by injection.  You are sexually active with a partner who has HIV. Talk with your health care provider about whether you are at high risk of being infected with HIV. If you choose to begin PrEP, you should first be tested for HIV. You should then be tested every 3 months for as long as you are taking PrEP. Pregnancy  If you are premenopausal and you may become pregnant, ask your health care provider about preconception counseling.  If you may become pregnant, take 400 to 800 micrograms (mcg) of folic acid every day.  If you want to prevent pregnancy, talk to your health care provider about birth control (contraception). Osteoporosis and menopause  Osteoporosis is a disease in which the bones lose minerals and strength with aging. This can result in serious bone fractures. Your risk for osteoporosis can be identified using a bone density scan.  If you are 50 years of age or older,  or if you are at risk for osteoporosis and fractures, ask your health care provider if you should be screened.  Ask your health care provider whether you should take a calcium or vitamin D  supplement to lower your risk for osteoporosis.  Menopause may have certain physical symptoms and risks.  Hormone replacement therapy may reduce some of these symptoms and risks. Talk to your health care provider about whether hormone replacement therapy is right for you. Follow these instructions at home:  Schedule regular health, dental, and eye exams.  Stay current with your immunizations.  Do not use any tobacco products including cigarettes, chewing tobacco, or electronic cigarettes.  If you are pregnant, do not drink alcohol.  If you are breastfeeding, limit how much and how often you drink alcohol.  Limit alcohol intake to no more than 1 drink per day for nonpregnant women. One drink equals 12 ounces of beer, 5 ounces of wine, or 1 ounces of hard liquor.  Do not use street drugs.  Do not share needles.  Ask your health care provider for help if you need support or information about quitting drugs.  Tell your health care provider if you often feel depressed.  Tell your health care provider if you have ever been abused or do not feel safe at home. This information is not intended to replace advice given to you by your health care provider. Make sure you discuss any questions you have with your health care provider. Document Released: 07/21/2010 Document Revised: 06/13/2015 Document Reviewed: 10/09/2014 Elsevier Interactive Patient Education  2017 [place] Prevention in the Home Falls can cause injuries and can affect people from all age groups. There are many simple things that you can do to make your home safe and to help prevent falls. What can I do on the outside of my home?  Regularly repair the edges of walkways and driveways and fix any cracks.  Remove high doorway thresholds.  Trim any shrubbery on the main path into your home.  Use bright outdoor lighting.  Clear walkways of debris and clutter, including tools and rocks.  Regularly check that  handrails are securely fastened and in good repair. Both sides of any steps should have handrails.  Install guardrails along the edges of any raised decks or porches.  Have leaves, snow, and ice cleared regularly.  Use sand or salt on walkways during winter months.  In the garage, clean up any spills right away, including grease or oil spills. What can I do in the bathroom?  Use night lights.  Install grab bars by the toilet and in the tub and shower. Do not use towel bars as grab bars.  Use non-skid mats or decals on the floor of the tub or shower.  If you need to sit down while you are in the shower, use a plastic, non-slip stool.  Keep the floor dry. Immediately clean up any water that spills on the floor.  Remove soap buildup in the tub or shower on a regular basis.  Attach bath mats securely with double-sided non-slip rug tape.  Remove throw rugs and other tripping hazards from the floor. What can I do in the bedroom?  Use night lights.  Make sure that a bedside light is easy to reach.  Do not use oversized bedding that drapes onto the floor.  Have a firm chair that has side arms to use for getting dressed.  Remove throw rugs and other tripping hazards from the  floor. What can I do in the kitchen?  Clean up any spills right away.  Avoid walking on wet floors.  Place frequently used items in easy-to-reach places.  If you need to reach for something above you, use a sturdy step stool that has a grab bar.  Keep electrical cables out of the way.  Do not use floor polish or wax that makes floors slippery. If you have to use wax, make sure that it is non-skid floor wax.  Remove throw rugs and other tripping hazards from the floor. What can I do in the stairways?  Do not leave any items on the stairs.  Make sure that there are handrails on both sides of the stairs. Fix handrails that are broken or loose. Make sure that handrails are as long as the  stairways.  Check any carpeting to make sure that it is firmly attached to the stairs. Fix any carpet that is loose or worn.  Avoid having throw rugs at the top or bottom of stairways, or secure the rugs with carpet tape to prevent them from moving.  Make sure that you have a light switch at the top of the stairs and the bottom of the stairs. If you do not have them, have them installed. What are some other fall prevention tips?  Wear closed-toe shoes that fit well and support your feet. Wear shoes that have rubber soles or low heels.  When you use a stepladder, make sure that it is completely opened and that the sides are firmly locked. Have someone hold the ladder while you are using it. Do not climb a closed stepladder.  Add color or contrast paint or tape to grab bars and handrails in your home. Place contrasting color strips on the first and last steps.  Use mobility aids as needed, such as canes, walkers, scooters, and crutches.  Turn on lights if it is dark. Replace any light bulbs that burn out.  Set up furniture so that there are clear paths. Keep the furniture in the same spot.  Fix any uneven floor surfaces.  Choose a carpet design that does not hide the edge of steps of a stairway.  Be aware of any and all pets.  Review your medicines with your healthcare provider. Some medicines can cause dizziness or changes in blood pressure, which increase your risk of falling. Talk with your health care provider about other ways that you can decrease your risk of falls. This may include working with a physical therapist or trainer to improve your strength, balance, and endurance. This information is not intended to replace advice given to you by your health care provider. Make sure you discuss any questions you have with your health care provider. Document Released: 12/26/2001 Document Revised: 06/04/2015 Document Reviewed: 02/09/2014 Elsevier Interactive Patient Education  2017  [employer].

## 2016-05-09 ENCOUNTER — Encounter: Payer: Self-pay | Admitting: Family Medicine

## 2016-07-03 ENCOUNTER — Telehealth: Payer: Self-pay

## 2016-07-03 ENCOUNTER — Telehealth: Payer: Self-pay | Admitting: Family Medicine

## 2016-07-03 NOTE — Telephone Encounter (Signed)
Millie (daughter) dropped off FMLA form to be redone.   Original documentation is not what is needed for Millie to get off work and take care of her mom.  Dustin called Gaylord Shih, [phone]) who has the original paper work.  Will need for it to be faxed over as we do not have a copy in the patient's chart.  Will wait for it to arrive.

## 2016-07-03 NOTE — Telephone Encounter (Signed)
Pt's daughter called to report that pt has returned from [place] where she stayed for a month. Daughter reports that pt's BLE's are extremely swollen. She has had pt elevate her legs but it has not improved. Pt has hx of multiple PE's and DVT. Advised daughter that pt needs to be seen and evaluated immediately. She agrees to take pt to [facility] ED for evaluation. Nothing further needed at this time.

## 2016-07-05 ENCOUNTER — Emergency Department (HOSPITAL_COMMUNITY)
Admission: EM | Admit: 2016-07-05 | Discharge: 2016-07-05 | Disposition: A | Discharge status: Home / Self Care | Payer: Medicare Other | Attending: Emergency Medicine | Admitting: Emergency Medicine

## 2016-07-05 ENCOUNTER — Emergency Department (HOSPITAL_COMMUNITY): Payer: Medicare Other

## 2016-07-05 ENCOUNTER — Encounter (HOSPITAL_COMMUNITY): Payer: Self-pay

## 2016-07-05 ENCOUNTER — Ambulatory Visit (HOSPITAL_COMMUNITY)
Admission: EM | Admit: 2016-07-05 | Discharge: 2016-07-05 | Disposition: A | Discharge status: Nursing Home (Includes ICF) | Payer: Medicare Other | Source: Home / Self Care

## 2016-07-05 DIAGNOSIS — R05 Cough: Secondary | ICD-10-CM | POA: Insufficient documentation

## 2016-07-05 DIAGNOSIS — M79605 Pain in left leg: Secondary | ICD-10-CM

## 2016-07-05 DIAGNOSIS — Z79899 Other long term (current) drug therapy: Secondary | ICD-10-CM | POA: Diagnosis not present

## 2016-07-05 DIAGNOSIS — I10 Essential (primary) hypertension: Secondary | ICD-10-CM | POA: Insufficient documentation

## 2016-07-05 DIAGNOSIS — R059 Cough, unspecified: Secondary | ICD-10-CM

## 2016-07-05 MED ORDER — AZITHROMYCIN 250 MG PO TABS: ORAL_TABLET | ORAL | 0 refills | Status: DC

## 2016-07-05 MED ORDER — BENZONATATE 100 MG PO CAPS: 100.0000 mg | ORAL_CAPSULE | Freq: Three times a day (TID) | ORAL | 0 refills | Status: DC | PRN

## 2016-07-05 NOTE — Discharge Instructions (Signed)
Follow up with your md this week for recheck.  Follow up for your leg ultrasound.  They will contact you with the time to come get the ultrasound

## 2016-07-05 NOTE — ED Notes (Signed)
Called x2 for room no answer

## 2016-07-05 NOTE — ED Provider Notes (Signed)
[facility] DEPT Provider Note   CSN: [id number] Arrival date & time: 07/05/16  1959     History   Chief Complaint Chief Complaint  Patient presents with  . Cough  . Leg Swelling    HPI Deborah Bell is a 83 y.o. female.  Patient has had a cough for a number weeks. She also complains of some mild left leg swelling tenderness behind her knee. Patient does have a history of a PE before   The history is provided by the patient.  Cough  This is a new problem. The cough is non-productive. There has been no fever. Pertinent negatives include no chest pain and no headaches. She has tried nothing for the symptoms. The treatment provided no relief. She is not a smoker. Her past medical history does not include pneumonia.    Past Medical History:  Diagnosis Date  . Arthritis   . Diverticulitis   . History of fainting spells of unknown cause   . Hypercholesteremia   . Hypertension   . Pulmonary emboli [facility])     Patient Active Problem List   Diagnosis Date Noted  . Obesity, Class I, BMI 30-34.9 05/08/2016  . Incisional hernia of anterior abdominal wall without obstruction or gangrene 05/08/2016  . Hyperlipidemia 05/07/2016  . Chronic pain disorder 10/29/2015  . Essential hypertension 10/29/2015  . Unstable gait 10/29/2015  . Osteoarthritis of left knee 10/29/2015  . Vertigo 10/29/2015  . Low back pain with radiation, unspecified laterality 10/29/2015    Past Surgical History:  Procedure Laterality Date  . APPENDECTOMY    . CARPAL TUNNEL RELEASE    . HERNIA REPAIR  2011   mesh removed  . TONSILLECTOMY      OB History    No data available       Home Medications    Prior to Admission medications   Medication Sig Start Date End Date Taking? Authorizing Provider  amLODipine (NORVASC) 2.5 MG tablet Take 1 tablet (2.5 mg total) by mouth daily. 12/31/15  Yes Martinique, Betty G, MD  CALCIUM PO Take 1 tablet by mouth daily.   Yes [provider]    simvastatin (ZOCOR) 20 MG tablet Take 1 tablet (20 mg total) by mouth daily. 03/27/16  Yes Martinique, Betty G, MD  azithromycin (ZITHROMAX Z-PAK) 250 MG tablet 2 po day one, then 1 daily x 4 days 07/05/16   Milton Ferguson, MD    Family History Family History  Problem Relation Age of Onset  . Diabetes Mother   . Diabetes Father     Social History Social History  Substance Use Topics  . Smoking status: Never Smoker  . Smokeless tobacco: Never Used  . Alcohol use No     Allergies   Patient has no known allergies.   Review of Systems Review of Systems  Constitutional: Negative for appetite change and fatigue.  HENT: Negative for congestion, ear discharge and sinus pressure.   Eyes: Negative for discharge.  Respiratory: Positive for cough.   Cardiovascular: Negative for chest pain.  Gastrointestinal: Negative for abdominal pain and diarrhea.  Genitourinary: Negative for frequency and hematuria.  Musculoskeletal: Negative for back pain.       Pain and left lower leg  Skin: Negative for rash.  Neurological: Negative for seizures and headaches.  Psychiatric/Behavioral: Negative for hallucinations.     Physical Exam Updated Vital Signs BP (!) 169/77 (BP Location: Right Arm)   Pulse 91   Temp 98.7 F (37.1 C) (Oral)  Resp 18   SpO2 98%   Physical Exam  Constitutional: She is oriented to person, place, and time. She appears well-developed.  HENT:  Head: Normocephalic.  Eyes: Conjunctivae and EOM are normal. No scleral icterus.  Neck: Neck supple. No thyromegaly present.  Cardiovascular: Normal rate and regular rhythm.  Exam reveals no gallop and no friction rub.   No murmur heard. Pulmonary/Chest: No stridor. She has no wheezes. She has no rales. She exhibits no tenderness.  Abdominal: She exhibits no distension. There is no tenderness. There is no rebound.  Musculoskeletal: Normal range of motion. She exhibits no edema.  Tenderness to left lower calf and behind her  left knee neurovascular exam normal  Lymphadenopathy:    She has no cervical adenopathy.  Neurological: She is oriented to person, place, and time. She exhibits normal muscle tone. Coordination normal.  Skin: No rash noted. No erythema.  Psychiatric: She has a normal mood and affect. Her behavior is normal.     ED Treatments / Results  Labs (all labs ordered are listed, but only abnormal results are displayed) Labs Reviewed - No data to display  EKG  EKG Interpretation None       Radiology Dg Chest 2 View  Result Date: 07/05/2016 CLINICAL DATA:  83 year old female with cough and swelling of the ankles. EXAM: CHEST  2 VIEW COMPARISON:  Chest radiograph dated 10/26/2015 FINDINGS: The lungs are clear. There is no pleural effusion or pneumothorax. The cardiac silhouette is within normal limits. There is atherosclerotic calcification of the aortic arch. The aorta is tortuous. There is osteopenia with degenerative changes of the spine. No acute osseous pathology. IMPRESSION: No active cardiopulmonary disease. Electronically Signed   By: Anner Crete M.D.   On: 07/05/2016 22:16    Procedures Procedures (including critical care time)  Medications Ordered in ED Medications - No data to display   Initial Impression / Assessment and Plan / ED Course  I have reviewed the triage vital signs and the nursing notes.  Pertinent labs & imaging results that were available during my care of the patient were reviewed by me and considered in my medical decision making (see chart for details).     Patient with  cough. She will be placed on Z-Pak for possible persistent bronchitis. Patient will also get an ultrasound of her left leg to rule out DVT,  Final Clinical Impressions(s) / ED Diagnoses   Final diagnoses:  Cough    New Prescriptions New Prescriptions   AZITHROMYCIN (ZITHROMAX Z-PAK) 250 MG TABLET    2 po day one, then 1 daily x 4 days     Milton Ferguson, MD 07/05/16  2310

## 2016-07-05 NOTE — ED Notes (Signed)
Patient transported to X-ray 

## 2016-07-05 NOTE — ED Triage Notes (Signed)
Pt returned from [place] 07-02-16.  Onset 07-03-16 non productive cough and swelling to bilateral feet, pain in left lower leg.  No chest pain or shortness of breath.  H/o blood clot in left LE.

## 2016-07-06 ENCOUNTER — Ambulatory Visit (HOSPITAL_COMMUNITY)
Admission: RE | Admit: 2016-07-06 | Discharge: 2016-07-06 | Disposition: A | Discharge status: Home / Self Care | Payer: Medicare Other | Source: Ambulatory Visit | Attending: Emergency Medicine | Admitting: Emergency Medicine

## 2016-07-06 DIAGNOSIS — M25562 Pain in left knee: Secondary | ICD-10-CM | POA: Diagnosis not present

## 2016-07-06 DIAGNOSIS — M7989 Other specified soft tissue disorders: Secondary | ICD-10-CM | POA: Insufficient documentation

## 2016-07-06 DIAGNOSIS — M79609 Pain in unspecified limb: Secondary | ICD-10-CM | POA: Diagnosis not present

## 2016-07-06 NOTE — Progress Notes (Signed)
*  Preliminary Results* Left lower extremity venous duplex completed. Left lower extremity is negative for deep vein thrombosis. There is no evidence of left Baker's cyst.  07/06/2016 1:17 PM  Maudry Mayhew, BS, RVT, RDCS, RDMS

## 2016-07-07 ENCOUNTER — Ambulatory Visit (INDEPENDENT_AMBULATORY_CARE_PROVIDER_SITE_OTHER): Payer: Medicare Other | Admitting: Orthopaedic Surgery

## 2016-07-07 ENCOUNTER — Encounter (INDEPENDENT_AMBULATORY_CARE_PROVIDER_SITE_OTHER): Payer: Self-pay | Admitting: Orthopaedic Surgery

## 2016-07-07 VITALS — BP 138/75 | HR 77 | Resp 17 | Ht 59.0 in | Wt 139.0 lb

## 2016-07-07 DIAGNOSIS — M25562 Pain in left knee: Secondary | ICD-10-CM | POA: Diagnosis not present

## 2016-07-07 DIAGNOSIS — G8929 Other chronic pain: Secondary | ICD-10-CM | POA: Diagnosis not present

## 2016-07-07 DIAGNOSIS — M25561 Pain in right knee: Secondary | ICD-10-CM

## 2016-07-07 MED ORDER — METHYLPREDNISOLONE ACETATE 40 MG/ML IJ SUSP
80.0000 mg | INTRAMUSCULAR | Status: AC | PRN
  Administered 2016-07-07: 80 mg

## 2016-07-07 MED ORDER — LIDOCAINE HCL 1 % IJ SOLN
5.0000 mL | INTRAMUSCULAR | Status: AC | PRN
  Administered 2016-07-07: 5 mL

## 2016-07-07 MED ORDER — BUPIVACAINE HCL 0.5 % IJ SOLN
3.0000 mL | INTRAMUSCULAR | Status: AC | PRN
  Administered 2016-07-07: 3 mL via INTRA_ARTICULAR

## 2016-07-07 NOTE — Telephone Encounter (Signed)
Received blank paper work from [employer].  Called Millie to confirm what is needed for #6 &7.  Hung up the phone but need the start date.  Tried to reach her again.  Left a message for a return call.

## 2016-07-07 NOTE — Progress Notes (Signed)
Office Visit Note   Patient: Deborah Bell           Date of Birth: [birth year]           MRN: [id number] Visit Date: 07/07/2016              Requested by: Martinique, Betty G, MD [address] [place], [place] [address] PCP: Martinique, Betty G, MD   Assessment & Plan: Visit Diagnoses:  1. Chronic pain of both knees   Bilateral knee osteoarthritis  Plan: Repeat cortisone injection and left more symptomatic knee today. Follow-up in 2 weeks to inject right knee  Follow-Up Instructions: Return in about 2 weeks (around 07/21/2016).   Orders:  No orders of the defined types were placed in this encounter.  No orders of the defined types were placed in this encounter.     Procedures: Large Joint Inj Date/Time: 07/07/2016 12:27 PM Performed by: Garald Balding Authorized by: Garald Balding   Consent Given by:  Patient Timeout: prior to procedure the correct patient, procedure, and site was verified   Indications:  Pain and joint swelling Location:  Knee Site:  L knee Prep: patient was prepped and draped in usual sterile fashion   Needle Size:  25 G Needle Length:  1.5 inches Approach:  Anteromedial Ultrasound Guidance: No   Fluoroscopic Guidance: No   Arthrogram: No   Medications:  5 mL lidocaine 1 %; 80 mg methylPREDNISolone acetate 40 MG/ML; 3 mL bupivacaine 0.5 % Aspiration Attempted: No   Patient tolerance:  Patient tolerated the procedure well with no immediate complications     Clinical Data: No additional findings.   Subjective: Chief Complaint  Patient presents with  . Right Knee - Pain  . Left Knee - Pain  . Knee Pain    Bil knee pain, left worse than right, no injury, no surgery to knees, not diabetic, difficulty walking, difficulty sleeping, swelling in bil ankles and bil legs while in [place], Tylenol helps, wants injection   Last seen in December 2017. Established diagnosis of bilateral knee osteoarthritis. Responded nicely to  cortisone injections space several weeks apart in both knees. Having recurrent pain and knees possibly with increased activity. Does use a cane. HPI  Review of Systems   Objective: Vital Signs: BP 138/75 (BP Location: Left Arm, Patient Position: Sitting, Cuff Size: Normal)   Pulse 77   Resp 17   Ht 4\' 11"  (1.499 m)   Wt 139 lb (63 kg)   BMI 28.07 kg/m   Physical Exam  Ortho Exam awake alert and oriented 3. Language is  an issue. Daughter accompanies her to interpret. Full extension of left knee and flexion over 100 without instability. Minimal effusion. More medial than lateral joint pain. Some patella crepitation. No swelling distally. No calf pain  Specialty Comments:  No specialty comments available.  Imaging: No results found.   PMFS History: Patient Active Problem List   Diagnosis Date Noted  . Obesity, Class I, BMI 30-34.9 05/08/2016  . Incisional hernia of anterior abdominal wall without obstruction or gangrene 05/08/2016  . Hyperlipidemia 05/07/2016  . Chronic pain disorder 10/29/2015  . Essential hypertension 10/29/2015  . Unstable gait 10/29/2015  . Osteoarthritis of left knee 10/29/2015  . Vertigo 10/29/2015  . Low back pain with radiation, unspecified laterality 10/29/2015   Past Medical History:  Diagnosis Date  . Arthritis   . Diverticulitis   . History of fainting spells of unknown cause   .  Hypercholesteremia   . Hypertension   . Pulmonary emboli ([facility])     Family History  Problem Relation Age of Onset  . Diabetes Mother   . Diabetes Father     Past Surgical History:  Procedure Laterality Date  . APPENDECTOMY    . CARPAL TUNNEL RELEASE    . HERNIA REPAIR  2011   mesh removed  . TONSILLECTOMY     Social History   Occupational History  . Not on file.   Social History Main Topics  . Smoking status: Never Smoker  . Smokeless tobacco: Never Used  . Alcohol use No  . Drug use: No  . Sexual activity: Not Currently     Garald Balding, MD   Note - This record has been created using [employer].  Chart creation errors have been sought, but may not always  have been located. Such creation errors do not reflect on  the standard of medical care.

## 2016-07-08 NOTE — Telephone Encounter (Signed)
Left a message for a return call.  Still trying to get information for [employer]

## 2016-07-09 ENCOUNTER — Ambulatory Visit (INDEPENDENT_AMBULATORY_CARE_PROVIDER_SITE_OTHER): Payer: Medicare Other | Admitting: Adult Health

## 2016-07-09 ENCOUNTER — Encounter: Payer: Self-pay | Admitting: Adult Health

## 2016-07-09 VITALS — BP 158/62 | HR 63 | Ht 59.0 in | Wt 139.3 lb

## 2016-07-09 DIAGNOSIS — J40 Bronchitis, not specified as acute or chronic: Secondary | ICD-10-CM

## 2016-07-09 MED ORDER — PREDNISONE 20 MG PO TABS: 20.0000 mg | ORAL_TABLET | Freq: Every day | ORAL | 0 refills | Status: DC

## 2016-07-09 NOTE — Telephone Encounter (Signed)
Was able to speak to [facility].  The pt has an appt today with Tommi Rumps.  Millie will bring the originals with her to the appt.  Jerene Pitch has agreed to copy the 4 missing sections from the original to the new.  Will then place back on my desk.  I will have Dr. Martinique sign when she returns tomorrow and then fax to [phone].  Millie has agreed to plan.  FMLA form due 07/10/16 per Millie.

## 2016-07-09 NOTE — Progress Notes (Signed)
Subjective:    Patient ID: Deborah Bell, female    DOB: 03/02/1928, 83 y.o.   MRN: [id number]  HPI  83 year old female who  has a past medical history of Arthritis; Diverticulitis; History of fainting spells of unknown cause; Hypercholesteremia; Hypertension; and Pulmonary emboli ([place]). She is a patient of Dr. Martinique, who I am seeing today in her absence for ER follow up. She was seen in the ER 4 days ago with the complaint of cough and left leg swelling and tenderness  Per ER note:   Patient has had a cough for a number weeks. She also complains of some mild left leg swelling tenderness behind her knee. Patient does have a history of a PE before  The history is provided by the patient.  Cough  This is a new problem. The cough is non-productive. There has been no fever. Pertinent negatives include no chest pain and no headaches. She has tried nothing for the symptoms. The treatment provided no relief. She is not a smoker. Her past medical history does not include pneumonia.   She was placed on a z pack for possible bronchitis and chest xray was negative for any acute findings.   [place] was negative for DVT or Bakers Cyst   Today in the office she reports that she has finished her antibiotics but despite this she continues to have a cough that she feels has become worse. She reports that she has stared having a productive cough and is wheezing more.   Denies any fevers, n/v/d   Review of Systems See HPI   Past Medical History:  Diagnosis Date  . Arthritis   . Diverticulitis   . History of fainting spells of unknown cause   . Hypercholesteremia   . Hypertension   . Pulmonary emboli [facility])     Social History   Social History  . Marital status: Legally Separated    Spouse name: N/A  . Number of children: N/A  . Years of education: N/A   Occupational History  . Not on file.   Social History Main Topics  . Smoking status: Never Smoker  . Smokeless tobacco: Never Used    . Alcohol use No  . Drug use: No  . Sexual activity: Not Currently   Other Topics Concern  . Not on file   Social History Narrative  . No narrative on file    Past Surgical History:  Procedure Laterality Date  . APPENDECTOMY    . CARPAL TUNNEL RELEASE    . HERNIA REPAIR  2011   mesh removed  . TONSILLECTOMY      Family History  Problem Relation Age of Onset  . Diabetes Mother   . Diabetes Father     No Known Allergies  Current Outpatient Prescriptions on File Prior to Visit  Medication Sig Dispense Refill  . amLODipine (NORVASC) 2.5 MG tablet Take 1 tablet (2.5 mg total) by mouth daily. 90 tablet 1  . benzonatate (TESSALON) 100 MG capsule Take 1 capsule (100 mg total) by mouth 3 (three) times daily as needed for cough. 15 capsule 0  . CALCIUM PO Take 1 tablet by mouth daily.    . simvastatin (ZOCOR) 20 MG tablet Take 1 tablet (20 mg total) by mouth daily. 90 tablet 1   No current facility-administered medications on file prior to visit.     BP (!) 158/62 (BP Location: Left Arm, Patient Position: Sitting, Cuff Size: Normal)   Pulse 63  Ht 4\' 11"  (1.499 m)   Wt 139 lb 4.8 oz (63.2 kg)   SpO2 94%   BMI 28.14 kg/m       Objective:   Physical Exam  Constitutional: She is oriented to person, place, and time. She appears well-developed and well-nourished. No distress.  HENT:  Head: Normocephalic and atraumatic.  Right Ear: External ear normal.  Left Ear: External ear normal.  Nose: Nose normal.  Mouth/Throat: Oropharynx is clear and moist. No oropharyngeal exudate.  Eyes: Conjunctivae and EOM are normal. Pupils are equal, round, and reactive to light. Right eye exhibits no discharge. Left eye exhibits no discharge. No scleral icterus.  Neck: Normal range of motion. Neck supple.  Cardiovascular: Normal rate, regular rhythm, normal heart sounds and intact distal pulses.  Exam reveals no gallop and no friction rub.   No murmur heard. Pulmonary/Chest: Effort  normal. She has wheezes (expiratory wheezes throughout).  Lymphadenopathy:    She has no cervical adenopathy.  Neurological: She is alert and oriented to person, place, and time.  Skin: Skin is warm and dry. No rash noted. She is not diaphoretic. No erythema. No pallor.  Psychiatric: She has a normal mood and affect. Her behavior is normal. Judgment and thought content normal.  Nursing note and vitals reviewed.     Assessment & Plan:  1. Bronchitis - predniSONE (DELTASONE) 20 MG tablet; Take 1 tablet (20 mg total) by mouth daily with breakfast.  Dispense: 7 tablet; Refill: 0 - Follow up with PCP if no improvement in the next 2-3 days   Dorothyann Peng, NP

## 2016-07-10 ENCOUNTER — Encounter: Payer: Self-pay | Admitting: Family Medicine

## 2016-07-10 NOTE — Telephone Encounter (Signed)
Pts daughter would like to have the forms faxed to [phone] with the cover sheet that was left with the paperwork on yesterday and please fax a copy to [facility] at [phone] but before faxing to [facility] please give her a call so that she will be on the look out for the information.

## 2016-07-10 NOTE — Telephone Encounter (Signed)
Dr. Martinique signed FMLA forms.  Faxed to [facility] and external review.  Copy sent to scan and I retained a copy for myself.

## 2016-07-27 ENCOUNTER — Ambulatory Visit (INDEPENDENT_AMBULATORY_CARE_PROVIDER_SITE_OTHER): Payer: Medicare Other | Admitting: Orthopaedic Surgery

## 2016-09-08 NOTE — Progress Notes (Signed)
In my note it states the pt has left leg swelling and tenderness behind the left knee

## 2016-09-25 ENCOUNTER — Telehealth (INDEPENDENT_AMBULATORY_CARE_PROVIDER_SITE_OTHER): Payer: Self-pay | Admitting: Physical Medicine and Rehabilitation

## 2016-09-25 NOTE — Telephone Encounter (Signed)
Patient daughter came in stating mother has intense pain and may need another injection. Please call patient daughter Holland Commons with appt

## 2016-09-28 NOTE — Telephone Encounter (Signed)
Ok if helped 

## 2016-09-28 NOTE — Telephone Encounter (Signed)
Pt scheduled for 10/01/16 @ 1:00 and put on waiting list.

## 2016-10-01 ENCOUNTER — Encounter (INDEPENDENT_AMBULATORY_CARE_PROVIDER_SITE_OTHER): Payer: Self-pay | Admitting: Physical Medicine and Rehabilitation

## 2016-10-01 ENCOUNTER — Ambulatory Visit (INDEPENDENT_AMBULATORY_CARE_PROVIDER_SITE_OTHER): Payer: Self-pay

## 2016-10-01 ENCOUNTER — Ambulatory Visit (INDEPENDENT_AMBULATORY_CARE_PROVIDER_SITE_OTHER): Payer: Medicare Other | Admitting: Physical Medicine and Rehabilitation

## 2016-10-01 VITALS — HR 74 | Temp 98.2°F

## 2016-10-01 DIAGNOSIS — M5442 Lumbago with sciatica, left side: Secondary | ICD-10-CM | POA: Diagnosis not present

## 2016-10-01 DIAGNOSIS — M5441 Lumbago with sciatica, right side: Secondary | ICD-10-CM

## 2016-10-01 DIAGNOSIS — G8929 Other chronic pain: Secondary | ICD-10-CM

## 2016-10-01 DIAGNOSIS — M5416 Radiculopathy, lumbar region: Secondary | ICD-10-CM

## 2016-10-01 MED ORDER — METHYLPREDNISOLONE ACETATE 80 MG/ML IJ SUSP
80.0000 mg | Freq: Once | INTRAMUSCULAR | Status: AC
  Administered 2016-10-01: 80 mg

## 2016-10-01 MED ORDER — LIDOCAINE HCL (PF) 1 % IJ SOLN
2.0000 mL | Freq: Once | INTRAMUSCULAR | Status: AC
  Administered 2016-10-01: 2 mL

## 2016-10-01 NOTE — Progress Notes (Deleted)
Pain across lower back into both hips. Left=right. Radiates down both legs. Worse with standing. Numbness and tingling in both feet. Denies any weakness.

## 2016-10-01 NOTE — Patient Instructions (Signed)

## 2016-10-05 NOTE — Procedures (Signed)
Mrs. Patterson Hammersmith is a very pleasant 83 year old female, and her daughter provided some of the history and does do some translation. The patient does speak in [place]. Her history briefly is that she has follow-up Dr. Durward Fortes in the past for orthopedic care. A sacroiliac joint injection for more buttock pain did not help very much at the beginning of the year and we followed this with an intralaminar epidural steroid injection which gave her great relief for several months. This injection was in March. Her symptoms have returned most recently in her bilateral now more than one sided. She had more right than left in the past. She still has a little bit more right than left but it is in both hips and she gets some referral pattern down the legs. She's had no focal weakness or falls or trauma. I think it's fair to repeat the epidural injection one time. If she gets good relief for quite a while then we can discuss the future plans. I do think were going to need an updated MRI depending on the relief with this last injection. Lumbar Epidural Steroid Injection - Interlaminar Approach with Fluoroscopic Guidance  Patient: MICAELA STITH      Date of Birth: 07-Nov-1928 MRN: [id number] PCP: Martinique, Betty G, MD      Visit Date: 10/01/2016   Universal Protocol:     Consent Given By: the patient  Position: PRONE  Additional Comments: Vital signs were monitored before and after the procedure. Patient was prepped and draped in the usual sterile fashion. The correct patient, procedure, and site was verified.   Injection Procedure Details:  Procedure Site One Meds Administered:  Meds ordered this encounter  Medications  . lidocaine (PF) (XYLOCAINE) 1 % injection 2 mL  . methylPREDNISolone acetate (DEPO-MEDROL) injection 80 mg     Laterality: Right  Location/Site:  L5-S1  Needle size: 20 G  Needle type: Tuohy  Needle Placement: Paramedian epidural  Findings:  -Contrast Used: 1 mL iohexol 180  mg iodine/mL   -Comments: Excellent flow of contrast into the epidural space.  Procedure Details: Using a paramedian approach from the side mentioned above, the region overlying the inferior lamina was localized under fluoroscopic visualization and the soft tissues overlying this structure were infiltrated with 4 ml. of 1% Lidocaine without Epinephrine. The Tuohy needle was inserted into the epidural space using a paramedian approach.   The epidural space was localized using loss of resistance along with lateral and bi-planar fluoroscopic views.  After negative aspirate for air, blood, and CSF, a 2 ml. volume of Isovue-250 was injected into the epidural space and the flow of contrast was observed. Radiographs were obtained for documentation purposes.    The injectate was administered into the level noted above.   Additional Comments:  The patient tolerated the procedure well Dressing: Band-Aid    Post-procedure details: Patient was observed during the procedure. Post-procedure instructions were reviewed.  Patient left the clinic in stable condition.

## 2016-10-06 ENCOUNTER — Other Ambulatory Visit: Payer: Self-pay | Admitting: Family Medicine

## 2016-10-06 DIAGNOSIS — I10 Essential (primary) hypertension: Secondary | ICD-10-CM

## 2016-10-23 ENCOUNTER — Encounter (INDEPENDENT_AMBULATORY_CARE_PROVIDER_SITE_OTHER): Payer: Self-pay | Admitting: Orthopaedic Surgery

## 2016-10-23 ENCOUNTER — Ambulatory Visit (INDEPENDENT_AMBULATORY_CARE_PROVIDER_SITE_OTHER): Payer: Medicare Other | Admitting: Orthopaedic Surgery

## 2016-10-23 VITALS — BP 149/76 | HR 73 | Resp 14 | Ht 59.0 in | Wt 140.0 lb

## 2016-10-23 DIAGNOSIS — M1711 Unilateral primary osteoarthritis, right knee: Secondary | ICD-10-CM | POA: Diagnosis not present

## 2016-10-23 DIAGNOSIS — M170 Bilateral primary osteoarthritis of knee: Secondary | ICD-10-CM

## 2016-10-23 DIAGNOSIS — M5440 Lumbago with sciatica, unspecified side: Secondary | ICD-10-CM | POA: Diagnosis not present

## 2016-10-23 DIAGNOSIS — M1712 Unilateral primary osteoarthritis, left knee: Secondary | ICD-10-CM

## 2016-10-23 DIAGNOSIS — M545 Low back pain, unspecified: Secondary | ICD-10-CM

## 2016-10-23 MED ORDER — LIDOCAINE HCL 1 % IJ SOLN
5.0000 mL | INTRAMUSCULAR | Status: AC | PRN
  Administered 2016-10-23: 5 mL

## 2016-10-23 MED ORDER — METHYLPREDNISOLONE ACETATE 40 MG/ML IJ SUSP
80.0000 mg | INTRAMUSCULAR | Status: AC | PRN
  Administered 2016-10-23: 80 mg

## 2016-10-23 MED ORDER — BUPIVACAINE HCL 0.5 % IJ SOLN
3.0000 mL | INTRAMUSCULAR | Status: AC | PRN
  Administered 2016-10-23: 3 mL via INTRA_ARTICULAR

## 2016-10-23 NOTE — Progress Notes (Signed)
Office Visit Note   Patient: Deborah Bell           Date of Birth: 10/28/1928           MRN: [id number] Visit Date: 10/23/2016              Requested by: Martinique, Betty G, MD [address] [place], [place] [address] PCP: Martinique, Betty G, MD   Assessment & Plan: Visit Diagnoses:  1. Bilateral primary osteoarthritis of knee   2. Low back pain with radiation, unspecified laterality     Plan: Cortisone injection left knee and follow-up as needed. Long discussion regarding her back in her knee with treatment options over time  Follow-Up Instructions: Return if symptoms worsen or fail to improve.   Orders:  Orders Placed This Encounter  Procedures  . Large Joint Injection/Arthrocentesis   No orders of the defined types were placed in this encounter.     Procedures: Large Joint Inj Date/Time: 10/23/2016 10:10 AM Performed by: Garald Balding Authorized by: Garald Balding   Consent Given by:  Patient Timeout: prior to procedure the correct patient, procedure, and site was verified   Indications:  Pain and joint swelling Location:  Knee Site:  L knee Prep: patient was prepped and draped in usual sterile fashion   Needle Size:  25 G Needle Length:  1.5 inches Approach:  Anteromedial Ultrasound Guidance: No   Fluoroscopic Guidance: No   Arthrogram: No   Medications:  5 mL lidocaine 1 %; 80 mg methylPREDNISolone acetate 40 MG/ML; 3 mL bupivacaine 0.5 % Aspiration Attempted: No   Patient tolerance:  Patient tolerated the procedure well with no immediate complications     Clinical Data: No additional findings.   Subjective: Chief Complaint  Patient presents with  . Right Knee - Pain, Edema    Ms. Deborah Bell is an 83 y o that is here for chronic BIL knee and lower lumbar pain. She had been seeing Dr. Ernestina Patches for injections in her hips but her daughter relates she always complains about her back hurting  . Left Knee - Pain, Edema  . Lower Back - Pain,  Edema  Deborah Bell accompanied by her daughter and here for follow-up evaluation of the osteoarthritis in both knees and her degenerative arthritis lumbar spine. She has been to Dr. Ernestina Patches for epidural steroid injections and actually feeling a little bit better. She does use a cane for ambulatory aid and predominantly for her "balance". I last saw her in June for injection of her left knee. She notes that it really made a difference on to have the pain recur. She is planning on some trips to visit family members  And wanted a cortisone injection. No recent injury or trauma. Low back pain is presently under control. No significant radiculopathy  HPI  Review of Systems  Constitutional: Negative for chills, fatigue and fever.  HENT: Positive for hearing loss.   Eyes: Negative for itching.  Respiratory: Negative for chest tightness and shortness of breath.   Cardiovascular: Negative for chest pain, palpitations and leg swelling.  Gastrointestinal: Negative for blood in stool, constipation and diarrhea.  Endocrine: Negative for polyuria.  Genitourinary: Negative for dysuria.  Musculoskeletal: Positive for back pain. Negative for joint swelling, neck pain and neck stiffness.  Allergic/Immunologic: Negative for immunocompromised state.  Neurological: Negative for dizziness and numbness.  Hematological: Does not bruise/bleed easily.  Psychiatric/Behavioral: The patient is not nervous/anxious.      Objective: Vital Signs: BP [name])  149/76   Pulse 73   Resp 14   Ht 4\' 11"  (1.499 m)   Wt 140 lb (63.5 kg)   BMI 28.28 kg/m   Physical Exam  Ortho Exam awake alert and oriented 3. Very comfortable sitting. Does ambulate with a cane. Nicely dressed no shortness of breath or chest pain. Straight leg raise negative bilaterally. Mild percussible lower lumbar pain. Painless range of motion of both hips. Full extension left knee with very minimal effusion. Predominantly medial joint pain. Some patellar  crepitation. No popliteal pain or mass. No popping or calf pain. Neurovascular exam  Specialty Comments:  No specialty comments available.  Imaging: No results found.   PMFS History: Patient Active Problem List   Diagnosis Date Noted  . Obesity, Class I, BMI 30-34.9 05/08/2016  . Incisional hernia of anterior abdominal wall without obstruction or gangrene 05/08/2016  . Hyperlipidemia 05/07/2016  . Chronic pain disorder 10/29/2015  . Essential hypertension 10/29/2015  . Unstable gait 10/29/2015  . Osteoarthritis of left knee 10/29/2015  . Vertigo 10/29/2015  . Low back pain with radiation, unspecified laterality 10/29/2015   Past Medical History:  Diagnosis Date  . Arthritis   . Diverticulitis   . History of fainting spells of unknown cause   . Hypercholesteremia   . Hypertension   . Pulmonary emboli (HCC)     Family History  Problem Relation Age of Onset  . Diabetes Mother   . Diabetes Father     Past Surgical History:  Procedure Laterality Date  . APPENDECTOMY    . CARPAL TUNNEL RELEASE    . HERNIA REPAIR  2011   mesh removed  . TONSILLECTOMY     Social History   Occupational History  . Not on file.   Social History Main Topics  . Smoking status: Never Smoker  . Smokeless tobacco: Never Used  . Alcohol use No  . Drug use: No  . Sexual activity: Not Currently

## 2016-11-09 DIAGNOSIS — K136 Irritative hyperplasia of oral mucosa: Secondary | ICD-10-CM | POA: Diagnosis not present

## 2016-11-19 ENCOUNTER — Telehealth (INDEPENDENT_AMBULATORY_CARE_PROVIDER_SITE_OTHER): Payer: Self-pay | Admitting: Physical Medicine and Rehabilitation

## 2016-11-19 DIAGNOSIS — M5416 Radiculopathy, lumbar region: Secondary | ICD-10-CM

## 2016-11-19 NOTE — Telephone Encounter (Signed)
Order placed. Cosign required.

## 2016-11-19 NOTE — Telephone Encounter (Signed)
She needs lumbar spine MRI without contrast.

## 2016-11-20 ENCOUNTER — Inpatient Hospital Stay: Admission: RE | Admit: 2016-11-20 | Payer: Medicare Other | Source: Ambulatory Visit

## 2016-12-09 ENCOUNTER — Telehealth: Payer: Self-pay | Admitting: Family Medicine

## 2016-12-09 NOTE — Telephone Encounter (Signed)
Deborah Bell pts daughter dropped off FMLA form pgs 2-4 along with the old FMLA form and would like for them to be faxed to [phone] Attn: Patrick North.  Form was put in the doctors folder.

## 2016-12-18 NOTE — Telephone Encounter (Signed)
Left message to give clinic a call back for information concerning FMLA paperwork.

## 2016-12-19 ENCOUNTER — Ambulatory Visit
Admission: RE | Admit: 2016-12-19 | Discharge: 2016-12-19 | Disposition: A | Discharge status: Home / Self Care | Payer: Medicare Other | Source: Ambulatory Visit | Attending: Physical Medicine and Rehabilitation | Admitting: Physical Medicine and Rehabilitation

## 2016-12-19 DIAGNOSIS — M5416 Radiculopathy, lumbar region: Secondary | ICD-10-CM

## 2016-12-19 DIAGNOSIS — M48061 Spinal stenosis, lumbar region without neurogenic claudication: Secondary | ICD-10-CM | POA: Diagnosis not present

## 2016-12-22 ENCOUNTER — Ambulatory Visit (INDEPENDENT_AMBULATORY_CARE_PROVIDER_SITE_OTHER): Payer: Medicare Other | Admitting: Podiatry

## 2016-12-22 DIAGNOSIS — G629 Polyneuropathy, unspecified: Secondary | ICD-10-CM | POA: Diagnosis not present

## 2016-12-22 DIAGNOSIS — L600 Ingrowing nail: Secondary | ICD-10-CM

## 2016-12-22 DIAGNOSIS — L601 Onycholysis: Secondary | ICD-10-CM

## 2016-12-22 MED ORDER — CEPHALEXIN 500 MG PO CAPS: 500.0000 mg | ORAL_CAPSULE | Freq: Three times a day (TID) | ORAL | 0 refills | Status: DC

## 2016-12-22 NOTE — Patient Instructions (Signed)

## 2016-12-23 ENCOUNTER — Telehealth (INDEPENDENT_AMBULATORY_CARE_PROVIDER_SITE_OTHER): Payer: Self-pay | Admitting: Radiology

## 2016-12-23 NOTE — Telephone Encounter (Signed)
Patient daughter calling left voicemail for [facility] scheduling, she had received a phone call asking if 2pm appointment for tomorrow was still available. She is wanting to go ahead and take this appointment time if available. Would like a call back so she can notify her employer. [phone]. Looked at appointment, and was gray stating on hold, will call back to schedule after speaking with [facility] for clarification. Per Loma Sousa ok to schedule. I called and spoke with daughter. Courtney moved appointment.

## 2016-12-23 NOTE — Progress Notes (Signed)
Subjective:   Patient ID: Deborah Bell, female   DOB: 83 y.o.   MRN: [id number]   HPI Deborah Bell presents the office with her daughter for concerns of a toenail issue.  She states that she gets pain to the left big toenail she had a pedicure done last week.  She states that after she had a pedicure done she was found she had ingrown toenail and she noticed increased pain to the nail and her daughter is concerned that there is infection along the nail at the nail is been very painful with pressure in shoes and she states that she could not wear shoes due to pain.  She states that her right hallux toenail also sits in the air causing pain with pressure.  Overall she does have numbness to her toes which is been ongoing for about 10 years and this is been somewhat getting worse.  She does have a history of get epidural injections in her low back and she has quite a bit of low back pain that she is currently seeing somebody for.  She is not diabetic.  She denies any cramping in her legs or walking she denies any claudication symptoms otherwise.  She has no other concerns today.   Review of Systems  All other systems reviewed and are negative.  Past Medical History:  Diagnosis Date  . Arthritis   . Diverticulitis   . History of fainting spells of unknown cause   . Hypercholesteremia   . Hypertension   . Pulmonary emboli [facility])     Past Surgical History:  Procedure Laterality Date  . APPENDECTOMY    . CARPAL TUNNEL RELEASE    . HERNIA REPAIR  2011   mesh removed  . TONSILLECTOMY       Current Outpatient Medications:  .  amLODipine (NORVASC) 2.5 MG tablet, TAKE ONE TABLET BY MOUTH DAILY, Disp: 90 tablet, Rfl: 0 .  benzonatate (TESSALON) 100 MG capsule, Take 1 capsule (100 mg total) by mouth 3 (three) times daily as needed for cough., Disp: 15 capsule, Rfl: 0 .  CALCIUM PO, Take 1 tablet by mouth daily., Disp: , Rfl:  .  predniSONE (DELTASONE) 20 MG tablet, Take 1 tablet (20 mg total) by  mouth daily with breakfast., Disp: 7 tablet, Rfl: 0 .  simvastatin (ZOCOR) 20 MG tablet, TAKE ONE TABLET BY MOUTH DAILY, Disp: 90 tablet, Rfl: 0 .  cephALEXin (KEFLEX) 500 MG capsule, Take 1 capsule (500 mg total) by mouth 3 (three) times daily., Disp: 30 capsule, Rfl: 0  No Known Allergies  Social History   Socioeconomic History  . Marital status: Legally Separated    Spouse name: Not on file  . Number of children: Not on file  . Years of education: Not on file  . Highest education level: Not on file  Social Needs  . Financial resource strain: Not on file  . Food insecurity - worry: Not on file  . Food insecurity - inability: Not on file  . Transportation needs - medical: Not on file  . Transportation needs - non-medical: Not on file  Occupational History  . Not on file  Tobacco Use  . Smoking status: Never Smoker  . Smokeless tobacco: Never Used  Substance and Sexual Activity  . Alcohol use: No  . Drug use: No  . Sexual activity: Not Currently  Other Topics Concern  . Not on file  Social History Narrative  . Not on file  Objective:  Physical Exam  General: AAO x3, NAD  Dermatological: There is significant incurvation present along the medial lateral aspect the left hallux toenail on the distal portion of the medial aspect the left hallux toenail there is what appears to be an old area of a hemorrhagic blister there is no drainage or pus expressed but there is tenderness along the entire nail border mostly on the medial aspect.  The nail is loose and the underlying nail bed.  The right hallux toenail is also loose from the underlying nail bed and there is tenderness the entire toenail although minimal.  There is no drainage or pus expressed.  There is no open lesions identified otherwise.  Vascular: Dorsalis Pedis artery and Posterior Tibial artery pedal pulses are 2/4 bilateral with immedate capillary fill time. There is no pain with calf compression, swelling,  warmth, erythema.   Neruologic: Sensation somewhat decreased with Derrel Nip monofilament.  Vibratory sensation intact.  Musculoskeletal: No gross boney pedal deformities bilateral. No pain, crepitus, or limitation noted with foot and ankle range of motion bilateral. Muscular strength 5/5 in all groups tested bilateral.     Assessment:   83 year old female with symptomatic ingrown toenail left hallux toenail; onychomycosis right hallux toenail; neuropathy     Plan:  -Treatment options discussed including all alternatives, risks, and complications -Etiology of symptoms were discussed  1.  Left hallux ingrown toenail -We discussed both conservative as well as surgical options.  The patient and her daughter wanted to go ahead and proceed with the procedure to remove the symptomatic portion of ingrown toenail left hallux toenail did not want to just debride the nail. -Although the symptoms are mostly on the medial aspect discussed going ahead and do the procedure medial lateral aspect and significant incurvation that feel that the lateral side will become symptomatic coming out of incurvation that she already has.  Understanding risks and complications she and her daughter wish to proceed with this and written consent was obtained. -At this time, the patient is requesting partial nail removal with chemical matricectomy to the symptomatic portion of the nail. Risks and complications were discussed with the patient for which they understand and written consent was obtained. Under sterile conditions a total of 3 mL of a mixture of 2% lidocaine plain and 0.5% Marcaine plain was infiltrated in a hallux block fashion. Once anesthetized, the skin was prepped in sterile fashion. A tourniquet was then applied. Next the medial and lateral aspect of hallux nail border was then sharply excised making sure to remove the entire offending nail border. Once the nails were ensured to be removed area was debrided  and the underlying skin was intact. There is no purulence identified in the procedure. Next phenol was then applied under standard conditions and copiously irrigated. Silvadene was applied. A dry sterile dressing was applied. After application of the dressing the tourniquet was removed and there is found to be an immediate capillary refill time to the digit. The patient tolerated the procedure well any complications. Post procedure instructions were discussed the patient for which he verbally understood. Follow-up in one week for nail check or sooner if any problems are to arise. Discussed signs/symptoms of infection and directed to call the office immediately should any occur or go directly to the emergency room. In the meantime, encouraged to call the office with any questions, concerns, changes symptoms.  2.  Right hallux onycholysis -Debrided the nail today without any complications or bleeding.  Discussed total nail  avulsion in the future if needed.  3.  Neuropathy. -I feel this is coming from her back.  This is been ongoing for 10 years and she has no pain associated only numbness.  Discussed medications for neuropathy but wished to hold off on that at this point.  Trula Slade DPM

## 2016-12-24 ENCOUNTER — Ambulatory Visit (INDEPENDENT_AMBULATORY_CARE_PROVIDER_SITE_OTHER): Payer: Medicare Other | Admitting: Physical Medicine and Rehabilitation

## 2016-12-24 ENCOUNTER — Encounter (INDEPENDENT_AMBULATORY_CARE_PROVIDER_SITE_OTHER): Payer: Self-pay | Admitting: Physical Medicine and Rehabilitation

## 2016-12-24 ENCOUNTER — Ambulatory Visit (INDEPENDENT_AMBULATORY_CARE_PROVIDER_SITE_OTHER): Payer: Medicare Other

## 2016-12-24 VITALS — BP 150/83 | HR 81

## 2016-12-24 DIAGNOSIS — M5441 Lumbago with sciatica, right side: Secondary | ICD-10-CM

## 2016-12-24 DIAGNOSIS — G8929 Other chronic pain: Secondary | ICD-10-CM

## 2016-12-24 DIAGNOSIS — M419 Scoliosis, unspecified: Secondary | ICD-10-CM

## 2016-12-24 DIAGNOSIS — M5442 Lumbago with sciatica, left side: Secondary | ICD-10-CM | POA: Diagnosis not present

## 2016-12-24 DIAGNOSIS — M48062 Spinal stenosis, lumbar region with neurogenic claudication: Secondary | ICD-10-CM | POA: Diagnosis not present

## 2016-12-24 MED ORDER — LIDOCAINE HCL (PF) 1 % IJ SOLN
2.0000 mL | Freq: Once | INTRAMUSCULAR | Status: AC
  Administered 2016-12-24: 2 mL

## 2016-12-24 MED ORDER — BETAMETHASONE SOD PHOS & ACET 6 (3-3) MG/ML IJ SUSP
12.0000 mg | Freq: Once | INTRAMUSCULAR | Status: AC
  Administered 2016-12-24: 12 mg

## 2016-12-24 NOTE — Progress Notes (Deleted)
Deborah Bell is an 83 year old female who comes in for  She is having so much pain (10/10). Worse when weightbearing. Ambulates with cane. Leaving to [place] on January 16, 2017.Taking Tylenol for pain. Numbness in toes. Patient had MRI L Spine done 12/19/16.

## 2016-12-24 NOTE — Telephone Encounter (Signed)
Patient's daughter came in to pick up FMLA paperwork to take to another provider to complete. Forms given to her. No further needs at this time.

## 2016-12-24 NOTE — Patient Instructions (Signed)

## 2016-12-28 ENCOUNTER — Other Ambulatory Visit: Payer: Medicare Other

## 2016-12-29 ENCOUNTER — Ambulatory Visit: Payer: Medicare Other | Admitting: Podiatry

## 2016-12-30 ENCOUNTER — Encounter (INDEPENDENT_AMBULATORY_CARE_PROVIDER_SITE_OTHER): Payer: Medicare Other | Admitting: Physical Medicine and Rehabilitation

## 2016-12-31 ENCOUNTER — Other Ambulatory Visit: Payer: Self-pay | Admitting: Family Medicine

## 2017-01-04 ENCOUNTER — Encounter (INDEPENDENT_AMBULATORY_CARE_PROVIDER_SITE_OTHER): Payer: Self-pay | Admitting: Orthopaedic Surgery

## 2017-01-04 ENCOUNTER — Ambulatory Visit (INDEPENDENT_AMBULATORY_CARE_PROVIDER_SITE_OTHER): Payer: Medicare Other | Admitting: Orthopaedic Surgery

## 2017-01-04 ENCOUNTER — Other Ambulatory Visit: Payer: Medicare Other

## 2017-01-04 VITALS — BP 127/72 | HR 66 | Resp 14 | Ht 59.0 in | Wt 150.0 lb

## 2017-01-04 DIAGNOSIS — M1712 Unilateral primary osteoarthritis, left knee: Secondary | ICD-10-CM | POA: Diagnosis not present

## 2017-01-04 MED ORDER — METHYLPREDNISOLONE ACETATE 40 MG/ML IJ SUSP
80.0000 mg | INTRAMUSCULAR | Status: AC | PRN
  Administered 2017-01-04: 80 mg

## 2017-01-04 MED ORDER — BUPIVACAINE HCL 0.5 % IJ SOLN
2.0000 mL | INTRAMUSCULAR | Status: AC | PRN
  Administered 2017-01-04: 2 mL via INTRA_ARTICULAR

## 2017-01-04 MED ORDER — LIDOCAINE HCL 1 % IJ SOLN
2.0000 mL | INTRAMUSCULAR | Status: AC | PRN
  Administered 2017-01-04: 2 mL

## 2017-01-04 NOTE — Progress Notes (Signed)
Office Visit Note   Patient: Deborah Bell           Date of Birth: [birth year]           MRN: [id number] Visit Date: 01/04/2017              Requested by: Martinique, Betty G, MD [address] [place], [place] [address] PCP: Martinique, Betty G, MD   Assessment & Plan: Visit Diagnoses:  1. Primary osteoarthritis of left knee     Plan: Reinject cortisone left knee. Discussed Visco supplementation. Family will check on benefits  Follow-Up Instructions: Return if symptoms worsen or fail to improve.   Orders:  No orders of the defined types were placed in this encounter.  No orders of the defined types were placed in this encounter.     Procedures: Large Joint Inj: L knee on 01/04/2017 4:00 PM Indications: pain and diagnostic evaluation Details: 25 G 1.5 in needle, anteromedial approach  Arthrogram: No  Medications: 2 mL lidocaine 1 %; 2 mL bupivacaine 0.5 %; 80 mg methylPREDNISolone acetate 40 MG/ML Procedure, treatment alternatives, risks and benefits explained, specific risks discussed. Consent was given by the patient. Patient was prepped and draped in the usual sterile fashion.       Clinical Data: No additional findings.   Subjective: Chief Complaint  Patient presents with  . Left Knee - Pain    Deborah Bell is an 83 y o with chronic left knee pain. Her last injection was 10/23/2016.  Cortisone injection 2-1/2 months ago made a "different" wishes to have "another". Have also discussed Visco supplementation family will check into the benefits. No recent injury or trauma  HPI  Review of Systems  Constitutional: Negative for chills, fatigue and fever.  Eyes: Negative for itching.  Respiratory: Negative for chest tightness and shortness of breath.   Cardiovascular: Negative for chest pain, palpitations and leg swelling.  Gastrointestinal: Negative for blood in stool, constipation and diarrhea.  Endocrine: Negative for polyuria.  Genitourinary: Negative  for dysuria.  Musculoskeletal: Negative for back pain, joint swelling, neck pain and neck stiffness.  Allergic/Immunologic: Negative for immunocompromised state.  Neurological: Positive for dizziness. Negative for numbness.  Hematological: Does not bruise/bleed easily.  Psychiatric/Behavioral: The patient is not nervous/anxious.      Objective: Vital Signs: BP 127/72   Pulse 66   Resp 14   Ht 4\' 11"  (1.499 m)   Wt 150 lb (68 kg)   BMI 30.30 kg/m   Physical Exam  Ortho Exam awake alert and oriented 3. Comfortable sitting. Walks with a minimal limp. Left knee with predominantly medial joint pain today. Minimal effusion. Some patellar crepitation. Full extension and flexes over 105. Good pulses distally.  Specialty Comments:  No specialty comments available.  Imaging: No results found.   PMFS History: Patient Active Problem List   Diagnosis Date Noted  . Obesity, Class I, BMI 30-34.9 05/08/2016  . Incisional hernia of anterior abdominal wall without obstruction or gangrene 05/08/2016  . Hyperlipidemia 05/07/2016  . Chronic pain disorder 10/29/2015  . Essential hypertension 10/29/2015  . Unstable gait 10/29/2015  . Osteoarthritis of left knee 10/29/2015  . Vertigo 10/29/2015  . Low back pain with radiation, unspecified laterality 10/29/2015   Past Medical History:  Diagnosis Date  . Arthritis   . Diverticulitis   . History of fainting spells of unknown cause   . Hypercholesteremia   . Hypertension   . Pulmonary emboli [facility])     Family History  Problem Relation Age of Onset  . Diabetes Mother   . Diabetes Father     Past Surgical History:  Procedure Laterality Date  . APPENDECTOMY    . CARPAL TUNNEL RELEASE    . HERNIA REPAIR  2011   mesh removed  . TONSILLECTOMY     Social History   Occupational History  . Not on file  Tobacco Use  . Smoking status: Never Smoker  . Smokeless tobacco: Never Used  Substance and Sexual Activity  . Alcohol use: No  .  Drug use: No  . Sexual activity: Not Currently

## 2017-01-04 NOTE — Procedures (Signed)
Deborah Bell is a pleasant 83 year old female accompanied by her daughter who provides a lot of the history.  She is a patient of Dr. Durward Fortes to have seen on 2 occasions.  We completed sacroiliac joint injection at his request without much relief and this was about a year ago.  We then completed interlaminar epidural steroid injection with great relief for several months.  Her symptoms have worsened over the last few months without trauma or weakness.  She does get pain down both legs now instead of just one leg she gets numbness in the toes at times which may not be related.  She has worsening symptoms with ambulation and using a cane.  We did update her MRI of her lumbar spine and we reviewed this with her today using spine models and the images.  This is reviewed below.  She has fairly severe multifactorial stenosis.  Going to complete bilateral transforaminal injection.  Lumbosacral Transforaminal Epidural Steroid Injection - Sub-Pedicular Approach with Fluoroscopic Guidance  Patient: Deborah Bell      Date of Birth: 12-28-1928 MRN: [id number] PCP: Martinique, Betty G, MD      Visit Date: 12/24/2016   Universal Protocol:    Date/Time: 12/24/2016  Consent Given By: the patient  Position: PRONE  Additional Comments: Vital signs were monitored before and after the procedure. Patient was prepped and draped in the usual sterile fashion. The correct patient, procedure, and site was verified.   Injection Procedure Details:  Procedure Site One Meds Administered:  Meds ordered this encounter  Medications  . lidocaine (PF) (XYLOCAINE) 1 % injection 2 mL  . betamethasone acetate-betamethasone sodium phosphate (CELESTONE) injection 12 mg    Laterality: Bilateral  Location/Site:  L3-L4  Needle size: 22 G  Needle type: Spinal  Needle Placement: Transforaminal  Findings:    -Comments: Excellent flow of contrast along the nerve and into the epidural space.  Procedure  Details: After squaring off the end-plates to get a true AP view, the C-arm was positioned so that an oblique view of the foramen as noted above was visualized. The target area is just inferior to the "nose of the scotty dog" or sub pedicular. The soft tissues overlying this structure were infiltrated with 2-3 ml. of 1% Lidocaine without Epinephrine.  The spinal needle was inserted toward the target using a "trajectory" view along the fluoroscope beam.  Under AP and lateral visualization, the needle was advanced so it did not puncture dura and was located close the 6 O'Clock position of the pedical in AP tracterory. Biplanar projections were used to confirm position. Aspiration was confirmed to be negative for CSF and/or blood. A 1-2 ml. volume of Isovue-250 was injected and flow of contrast was noted at each level. Radiographs were obtained for documentation purposes.   After attaining the desired flow of contrast documented above, a 0.5 to 1.0 ml test dose of 0.25% Marcaine was injected into each respective transforaminal space.  The patient was observed for 90 seconds post injection.  After no sensory deficits were reported, and normal lower extremity motor function was noted,   the above injectate was administered so that equal amounts of the injectate were placed at each foramen (level) into the transforaminal epidural space.   Additional Comments:  The patient tolerated the procedure well Dressing: Band-Aid    Post-procedure details: Patient was observed during the procedure. Post-procedure instructions were reviewed.  Patient left the clinic in stable condition.   Pertinent Imaging: MRI LUMBAR SPINE WITHOUT CONTRAST  TECHNIQUE: Multiplanar, multisequence MR imaging of the lumbar spine was performed. No intravenous contrast was administered.  COMPARISON:  Plain films 02/04/2016.  FINDINGS: Segmentation:  Standard.  Alignment: Severe degenerative scoliosis convex RIGHT  related to loss of interspace height on the LEFT at L3-4 and L2-3. Leftward translation L1 on L2. Trace anterolisthesis L3-L4. As correlated with plain films, scoliosis measures greater than 30 degrees.  Vertebrae: Other than endplate reactive changes most prominent at L3-4, no worrisome or significant osseous findings.  Conus medullaris and cauda equina: Conus extends to the L1 level. Conus and cauda equina appear normal.  Paraspinal and other soft tissues: BILATERAL renal cystic disease incompletely evaluated.  Disc levels:  L1-L2: Central protrusion. Posterior element hypertrophy, slightly worse on the LEFT. LEFT greater than RIGHT L2 nerve root impingement. LEFT L1 nerve root impingement due to foraminal narrowing. Mild to moderate stenosis is superimposed.  L2-L3: Annular bulge. Posterior element hypertrophy. Borderline stenosis. Asymmetric loss of interspace height on the LEFT. LEFT L2 and L3 nerve root impingement.  L3-L4: Near complete loss of interspace height at L3-4 on the LEFT. Marked osseous spurring. Posterior element hypertrophy. Central and leftward protrusion is superimposed. Trace anterolisthesis. Moderate to severe stenosis. Severe LEFT L3 and L4 nerve root impingement.  L4-L5: Good disc height and hydration. Posterior element hypertrophy. Annular bulge. Mild stenosis. Borderline subarticular zone narrowing on the RIGHT could affect the L5 nerve root.  L5-S1: Disc desiccation. Annular bulge. Facet arthropathy. No impingement.  IMPRESSION: The dominant abnormality is at L3-L4 where near complete loss of interspace height on the LEFT contributes to significant dextroconvex scoliosis (greater than 30 degrees), and bony overgrowth. Asymmetric posterior element hypertrophy along with leftward disc protrusion results in moderate to severe stenosis and LEFT L3/LEFT L4 nerve root impingement.  Similar less severe changes at L2-3 on the LEFT, with  asymmetric loss of interspace is height in conjunction with annular bulge and posterior element hypertrophy result in L2 and L3 nerve root impingement at that level.  Leftward translation L1 on L2. Central protrusion with posterior element hypertrophy. Correlate clinically for symptomatic LEFT L1 and LEFT L2 nerve root impingement.  Marked BILATERAL renal cystic disease, incompletely evaluated. If further investigation desired, consider sonography.   Electronically Signed   By: Staci Righter M.D.   On: 12/20/2016 07:56  02/04/2016 Lspine 4v Xray  AP and lateral lumbar spine x-ray shows moderate right-sided scoliosis  with a dextro rotatory component centered at about the L3-L4 region. There  is some rotation of the pelvis with some degenerative changes of the right  sacroiliac joint with fairly normal-appearing left sacroiliac joint. Hip  joints appear to show only mild degenerative changes with some decent  joint spaces. There are no fractures or dislocations.

## 2017-01-05 ENCOUNTER — Encounter: Payer: Self-pay | Admitting: Podiatry

## 2017-01-05 ENCOUNTER — Ambulatory Visit (INDEPENDENT_AMBULATORY_CARE_PROVIDER_SITE_OTHER): Payer: Medicare Other | Admitting: Podiatry

## 2017-01-05 ENCOUNTER — Ambulatory Visit (INDEPENDENT_AMBULATORY_CARE_PROVIDER_SITE_OTHER): Payer: Medicare Other

## 2017-01-05 DIAGNOSIS — D3610 Benign neoplasm of peripheral nerves and autonomic nervous system, unspecified: Secondary | ICD-10-CM

## 2017-01-05 DIAGNOSIS — L600 Ingrowing nail: Secondary | ICD-10-CM

## 2017-01-05 DIAGNOSIS — M659 Synovitis and tenosynovitis, unspecified: Secondary | ICD-10-CM

## 2017-01-08 NOTE — Progress Notes (Signed)
Subjective: Deborah Bell is a 83 y.o.  female returns to office today for follow up evaluation after having left Hallux partial permanent nail avulsion performed. Patient has been soaking using epsom salts and applying topical antibiotic covered with bandaid daily.  She states that he is doing well and is having no pain she denies any drainage or swelling or any redness.  Today she and her daughter have secondary concerns of a sharp pain in between her second and third toe which is been ongoing for several years.  She will be going to [place] this weekend and she wants to have this area checked.  She gets burning pain in between her second and third toes at times but she denies any recent injury or trauma.  She denies any treatment.  She denies any swelling.  She has no other concerns.  Patient denies fevers, chills, nausea, vomiting. Denies any calf pain, chest pain, SOB.   Objective:  Vitals: Reviewed  General: Well developed, nourished, in no acute distress, alert and oriented x3   Dermatology: Skin is warm, dry and supple bilateral. Left hallux nail border appears to be clean, dry, with mild granular tissue and surrounding scab. There is no surrounding erythema, edema, drainage/purulence. The remaining nails appear unremarkable at this time. There are no other lesions or other signs of infection present.  Neurovascular status: Intact. No lower extremity swelling; No pain with calf compression bilateral.  Musculoskeletal: Notenderness to palpation of the left hallux nail folds. Muscular strength within normal limits bilateral.  There is tenderness palpation of the second interspace of the left foot she does describe sharp pain in the second third toes.  There is no area pinpoint bony tenderness or pain to vibratory sensation.  There is no clicking sensation and no definitive neuroma identified but there is tenderness directly along the interspace with nerve type symptoms.  Assesement and  Plan: S/p partial nail avulsion, doing well; likely neuroma left second interspace   1 Ingrown toenail -Continue soaking in epsom salts twice a day followed by antibiotic ointment and a band-aid. Can leave uncovered at night. Continue this until completely healed.  -If the area has not healed in 2 weeks, call the office for follow-up appointment, or sooner if any problems arise.   2 Left second interspace pain, likely neuroma.  -X-rays were obtained and reviewed.  There is no evidence of acute fracture or stress fracture. -She wished to proceed with a steroid injection today.  See procedure note below. -Offloading pads were dispensed. -Discussed shoe modifications and inserts -Procedure:  Discussed alternatives, risks, complications and verbal consent was obtained.  Location: Left second interspace neuroma Skin Prep: Alcohol. Injectate: 1 cc 0.5% marcaine plain, 1 cc 0.5% Marcaine plain and, 1 cc kenalog 10. Disposition: Patient tolerated procedure well. Injection site dressed with a band-aid.  Post-injection care was discussed and return precautions discussed.    -Monitor for any signs/symptoms of infection. Call the office immediately if any occur or go directly to the emergency room. Call with any questions/concerns.  Celesta Gentile, DPM

## 2017-01-12 ENCOUNTER — Other Ambulatory Visit: Payer: Self-pay | Admitting: Family Medicine

## 2017-01-12 DIAGNOSIS — I10 Essential (primary) hypertension: Secondary | ICD-10-CM

## 2017-02-16 ENCOUNTER — Ambulatory Visit: Payer: Medicare Other | Admitting: Podiatry

## 2017-03-08 ENCOUNTER — Ambulatory Visit (INDEPENDENT_AMBULATORY_CARE_PROVIDER_SITE_OTHER): Payer: Medicare Other | Admitting: Orthopaedic Surgery

## 2017-03-08 ENCOUNTER — Encounter (INDEPENDENT_AMBULATORY_CARE_PROVIDER_SITE_OTHER): Payer: Self-pay | Admitting: Orthopaedic Surgery

## 2017-03-08 VITALS — BP 146/81 | HR 83 | Resp 16 | Ht 59.0 in | Wt 140.0 lb

## 2017-03-08 DIAGNOSIS — M1712 Unilateral primary osteoarthritis, left knee: Secondary | ICD-10-CM | POA: Diagnosis not present

## 2017-03-08 MED ORDER — BUPIVACAINE HCL 0.5 % IJ SOLN
2.0000 mL | INTRAMUSCULAR | Status: AC | PRN
  Administered 2017-03-08: 2 mL via INTRA_ARTICULAR

## 2017-03-08 MED ORDER — METHYLPREDNISOLONE ACETATE 40 MG/ML IJ SUSP
80.0000 mg | INTRAMUSCULAR | Status: AC | PRN
  Administered 2017-03-08: 80 mg

## 2017-03-08 MED ORDER — LIDOCAINE HCL 1 % IJ SOLN
2.0000 mL | INTRAMUSCULAR | Status: AC | PRN
  Administered 2017-03-08: 2 mL

## 2017-03-08 NOTE — Progress Notes (Signed)
Office Visit Note   Patient: Deborah Bell           Date of Birth: 06-22-1928           MRN: [id number] Visit Date: 03/08/2017              Requested by: Martinique, Betty G, MD [address] [place], [place] [address] PCP: Martinique, Betty G, MD   Assessment & Plan: Visit Diagnoses:  1. Primary osteoarthritis of left knee     Plan: Recurrent pain from osteoarthritis left knee. We will reinject cortisone. We will also revisit the possibility of Visco supplementation.  Follow-Up Instructions: Return if symptoms worsen or fail to improve.   Orders:  Orders Placed This Encounter  Procedures  . Large Joint Inj: L knee   No orders of the defined types were placed in this encounter.     Procedures: Large Joint Inj: L knee on 03/08/2017 9:48 AM Indications: pain and diagnostic evaluation Details: 25 G 1.5 in needle, anteromedial approach  Arthrogram: No  Medications: 2 mL lidocaine 1 %; 2 mL bupivacaine 0.5 %; 80 mg methylPREDNISolone acetate 40 MG/ML Procedure, treatment alternatives, risks and benefits explained, specific risks discussed. Consent was given by the patient. Patient was prepped and draped in the usual sterile fashion.       Clinical Data: No additional findings.   Subjective: Chief Complaint  Patient presents with  . Left Knee - Pain    Deborah Bell is an 84  y o here today for chronic left knee pain. She relates no groin or leg pain.  Last injection 12/17/ 2018.   Pain has recurred to the point of compromise. Does use a single-point cane. No fever or chills. No history of recent injury or trauma  HPI  Review of Systems  Constitutional: Negative for chills, fatigue and fever.  HENT: Negative for hearing loss and tinnitus.   Eyes: Negative for itching.  Respiratory: Negative for chest tightness and shortness of breath.   Cardiovascular: Negative for chest pain, palpitations and leg swelling.  Gastrointestinal: Negative for blood in stool,  constipation and diarrhea.  Endocrine: Negative for polyuria.  Genitourinary: Negative for dysuria.  Musculoskeletal: Positive for back pain. Negative for joint swelling, neck pain and neck stiffness.  Allergic/Immunologic: Negative for immunocompromised state.  Neurological: Negative for dizziness, numbness and headaches.  Hematological: Does not bruise/bleed easily.  Psychiatric/Behavioral: Negative for sleep disturbance. The patient is not nervous/anxious.      Objective: Vital Signs: BP (!) 146/81   Pulse 83   Resp 16   Ht 4\' 11"  (1.499 m)   Wt 140 lb (63.5 kg)   BMI 28.28 kg/m   Physical Exam  Ortho Exam awake and alert. Ambulates first slowly with a mild limp left lower extremity. Uses a single-point cane. No effusion left knee. Diffuse medial joint tenderness. Flexes about 100 extends fully. Good pulses distally. Motor exam intact.  Specialty Comments:  No specialty comments available.  Imaging: No results found.   PMFS History: Patient Active Problem List   Diagnosis Date Noted  . Obesity, Class I, BMI 30-34.9 05/08/2016  . Incisional hernia of anterior abdominal wall without obstruction or gangrene 05/08/2016  . Hyperlipidemia 05/07/2016  . Chronic pain disorder 10/29/2015  . Essential hypertension 10/29/2015  . Unstable gait 10/29/2015  . Osteoarthritis of left knee 10/29/2015  . Vertigo 10/29/2015  . Low back pain with radiation, unspecified laterality 10/29/2015   Past Medical History:  Diagnosis Date  .  Arthritis   . Diverticulitis   . History of fainting spells of unknown cause   . Hypercholesteremia   . Hypertension   . Pulmonary emboli (HCC)     Family History  Problem Relation Age of Onset  . Diabetes Mother   . Diabetes Father     Past Surgical History:  Procedure Laterality Date  . APPENDECTOMY    . CARPAL TUNNEL RELEASE    . HERNIA REPAIR  2011   mesh removed  . TONSILLECTOMY     Social History   Occupational History  . Not on  file  Tobacco Use  . Smoking status: Never Smoker  . Smokeless tobacco: Never Used  Substance and Sexual Activity  . Alcohol use: No  . Drug use: No  . Sexual activity: Not Currently

## 2017-03-17 ENCOUNTER — Telehealth: Payer: Self-pay | Admitting: Rheumatology

## 2017-03-17 NOTE — Telephone Encounter (Signed)
Deborah Bell calling requesting a call back to advise if you still prefer Hyalgan over Monovisc. Please call to advise.

## 2017-03-25 ENCOUNTER — Other Ambulatory Visit (INDEPENDENT_AMBULATORY_CARE_PROVIDER_SITE_OTHER): Payer: Medicare Other

## 2017-03-25 DIAGNOSIS — E785 Hyperlipidemia, unspecified: Secondary | ICD-10-CM

## 2017-03-25 DIAGNOSIS — I10 Essential (primary) hypertension: Secondary | ICD-10-CM | POA: Diagnosis not present

## 2017-03-25 LAB — LIPID PANEL
CHOL/HDL RATIO: 4
Cholesterol: 197 mg/dL (ref 0–200)
HDL: 47.5 mg/dL (ref >39.00)
LDL Cholesterol: 117 mg/dL — ABNORMAL HIGH (ref 0–99)
NonHDL: 149.55
TRIGLYCERIDES: 162 mg/dL — AB (ref 0.0–149.0)
VLDL: 32.4 mg/dL (ref 0.0–40.0)

## 2017-03-25 LAB — COMPREHENSIVE METABOLIC PANEL
ALK PHOS: 75 U/L (ref 39–117)
ALT: 16 U/L (ref 0–35)
AST: 22 U/L (ref 0–37)
Albumin: 4.1 g/dL (ref 3.5–5.2)
BILIRUBIN TOTAL: 0.6 mg/dL (ref 0.2–1.2)
BUN: 19 mg/dL (ref 6–23)
CALCIUM: 9.6 mg/dL (ref 8.4–10.5)
CO2: 31 mEq/L (ref 19–32)
CREATININE: 1.05 mg/dL (ref 0.40–1.20)
Chloride: 105 mEq/L (ref 96–112)
GFR: 52.74 mL/min — AB (ref >60.00)
Glucose, Bld: 87 mg/dL (ref 70–99)
Potassium: 3.7 mEq/L (ref 3.5–5.1)
Sodium: 145 mEq/L (ref 135–145)
TOTAL PROTEIN: 7.1 g/dL (ref 6.0–8.3)

## 2017-03-31 ENCOUNTER — Other Ambulatory Visit: Payer: Self-pay | Admitting: Family Medicine

## 2017-04-01 DIAGNOSIS — H40053 Ocular hypertension, bilateral: Secondary | ICD-10-CM | POA: Diagnosis not present

## 2017-04-01 DIAGNOSIS — H26493 Other secondary cataract, bilateral: Secondary | ICD-10-CM | POA: Diagnosis not present

## 2017-04-01 DIAGNOSIS — H40043 Steroid responder, bilateral: Secondary | ICD-10-CM | POA: Diagnosis not present

## 2017-04-01 DIAGNOSIS — H40013 Open angle with borderline findings, low risk, bilateral: Secondary | ICD-10-CM | POA: Diagnosis not present

## 2017-04-04 ENCOUNTER — Other Ambulatory Visit: Payer: Self-pay | Admitting: Family Medicine

## 2017-04-04 DIAGNOSIS — I10 Essential (primary) hypertension: Secondary | ICD-10-CM

## 2017-04-05 ENCOUNTER — Ambulatory Visit (INDEPENDENT_AMBULATORY_CARE_PROVIDER_SITE_OTHER): Payer: Medicare Other | Admitting: Family Medicine

## 2017-04-05 ENCOUNTER — Encounter: Payer: Self-pay | Admitting: Family Medicine

## 2017-04-05 VITALS — BP 126/84 | HR 71 | Temp 98.4°F | Resp 16 | Ht 59.0 in | Wt 141.0 lb

## 2017-04-05 DIAGNOSIS — H409 Unspecified glaucoma: Secondary | ICD-10-CM | POA: Diagnosis not present

## 2017-04-05 DIAGNOSIS — N183 Chronic kidney disease, stage 3 unspecified: Secondary | ICD-10-CM | POA: Insufficient documentation

## 2017-04-05 DIAGNOSIS — E785 Hyperlipidemia, unspecified: Secondary | ICD-10-CM

## 2017-04-05 DIAGNOSIS — I10 Essential (primary) hypertension: Secondary | ICD-10-CM | POA: Diagnosis not present

## 2017-04-05 MED ORDER — AMLODIPINE BESYLATE 2.5 MG PO TABS: 2.5000 mg | ORAL_TABLET | Freq: Every day | ORAL | 3 refills | Status: AC

## 2017-04-05 MED ORDER — SIMVASTATIN 20 MG PO TABS: 20.0000 mg | ORAL_TABLET | Freq: Every day | ORAL | 3 refills | Status: AC

## 2017-04-05 NOTE — Progress Notes (Signed)
HPI:   Ms.Karia L Barcellos is a 84 y.o. female, who is here today with her daughter for follow up.   She was last seen in 04/2016.  Since her last OV she has follow with orthopedist and pain management.  She is getting intra-articular joint injections ( Dr Durward Fortes) and epidural injection ( Dr Lucia Gaskins) every 3-4 months.    Hypertension:   Currently on Amlodipine 2.5 mg daily.  BP at home "good." She is taking medications as instructed, no side effects reported. Last eye exam about 2 days ago, diagnosed with left eye glaucoma.  She has not noted unusual headache, visual changes, exertional chest pain, dyspnea,  focal weakness, or edema.   Lab Results  Component Value Date   CREATININE 1.05 03/25/2017   BUN 19 03/25/2017   NA 145 03/25/2017   K 3.7 03/25/2017   CL 105 03/25/2017   CO2 31 03/25/2017   CKD 3, she has not noted forming the urine or gross hematuria. She does not take OTC NSAIDs and follows low-salt diet.    Hyperlipidemia:  Currently on Simvastatin 20 mg daily. Following a low fat diet: Yes.  She has not noted side effects with medication.  Lab Results  Component Value Date   CHOL 197 03/25/2017   HDL 47.50 03/25/2017   LDLCALC 117 (H) 03/25/2017   TRIG 162.0 (H) 03/25/2017   CHOLHDL 4 03/25/2017     Review of Systems  Constitutional: Negative for activity change, appetite change, fatigue and fever.  HENT: Negative for mouth sores, nosebleeds and trouble swallowing.   Eyes: Negative for redness and visual disturbance.  Respiratory: Negative for cough, shortness of breath and wheezing.   Cardiovascular: Negative for chest pain, palpitations and leg swelling.  Gastrointestinal: Negative for abdominal pain, nausea and vomiting.       Negative for changes in bowel habits.  Genitourinary: Negative for decreased urine volume, dysuria and hematuria.  Musculoskeletal: Positive for arthralgias, back pain and gait problem.  Skin: Negative for  rash and wound.  Neurological: Negative for syncope, weakness and headaches.  Psychiatric/Behavioral: Negative for confusion. The patient is nervous/anxious.      Current Outpatient Medications on File Prior to Visit  Medication Sig Dispense Refill  . NON FORMULARY [facility]  Onychomycosis Nail Lacquer -  Fluconazole 2%, Terbinafine 1% DMSO Apply to affected nail once daily Qty. 120 gm 3 refills    . CALCIUM PO Take 1 tablet by mouth daily.     No current facility-administered medications on file prior to visit.      Past Medical History:  Diagnosis Date  . Arthritis   . Diverticulitis   . History of fainting spells of unknown cause   . Hypercholesteremia   . Hypertension   . Pulmonary emboli (HCC)    No Known Allergies  Social History   Socioeconomic History  . Marital status: Legally Separated    Spouse name: None  . Number of children: None  . Years of education: None  . Highest education level: None  Social Needs  . Financial resource strain: None  . Food insecurity - worry: None  . Food insecurity - inability: None  . Transportation needs - medical: None  . Transportation needs - non-medical: None  Occupational History  . None  Tobacco Use  . Smoking status: Never Smoker  . Smokeless tobacco: Never Used  Substance and Sexual Activity  . Alcohol use: No  . Drug use: No  . Sexual  activity: Not Currently  Other Topics Concern  . None  Social History Narrative  . None    Vitals:   04/05/17 1625  BP: 126/84  Pulse: 71  Resp: 16  Temp: 98.4 F (36.9 C)  SpO2: 96%   Body mass index is 28.48 kg/m.   Physical Exam  Nursing note and vitals reviewed. Constitutional: She is oriented to person, place, and time. She appears well-developed and well-nourished. No distress.  HENT:  Head: Normocephalic and atraumatic.  Mouth/Throat: Oropharynx is clear and moist and mucous membranes are normal. She has dentures.  Eyes: Conjunctivae are normal.  Pupils are equal, round, and reactive to light.  Cardiovascular: Normal rate and regular rhythm.  Murmur (SEM I-II/VI RUSB) heard. Pulses:      Dorsalis pedis pulses are 2+ on the right side, and 2+ on the left side.  Respiratory: Effort normal and breath sounds normal. No respiratory distress.  GI: Soft. There is no tenderness.  Musculoskeletal: She exhibits no edema.  Lymphadenopathy:    She has no cervical adenopathy.  Neurological: She is alert and oriented to person, place, and time. She has normal strength. Coordination normal.  Stable gait assisted with a cane.  Skin: Skin is warm. No rash noted. No erythema.  Psychiatric: Her mood appears anxious.  Well groomed, good eye contact.    ASSESSMENT AND PLAN:   Ms. JAZIYA OBARR was seen today for follow-up.  Orders Placed This Encounter  Procedures  . Comprehensive metabolic panel  . Lipid panel  . VITAMIN D 25 Hydroxy (Vit-D Deficiency, Fractures)  . Microalbumin / creatinine urine ratio    Essential hypertension Adequately controlled. No changes in current management. Low-salt diet to continue. Eye exam recommended annually. Because she is following with other providers every 3-4 months at home, I think it is appropriate to follow annually,  Before if needed.   CKD (chronic kidney disease) stage 3, GFR 30-59 ml/min (HCC) E GFR has been stable. We discussed lab results for the past 2 years. Recommend continuing low-salt diet, avoiding NSAIDs. Adequate BP control and hydration. I think it is appropriate to follow annually.  Hyperlipidemia Last lipid panel mildly abnormal, TG mainly. I recommend continuing Simvastatin 20 mg daily. Continue low-fat diet. We will continue following annually.  Glaucoma Her daughter is not sure about next follow-up appointment, pending call to pick up eyeglasses. Some side effects of systemic steroids discussed. She will continue following with ophthalmologist.   She will  have lab work done in a year, a week before follow-up appointment.   -Ms. Dessiree L Coger was advised to return sooner than planned today if new concerns arise.       Ivori Storr G. Martinique, MD  [facility]. [place] office.

## 2017-04-05 NOTE — Patient Instructions (Addendum)
A few things to remember from today's visit:   Hyperlipidemia, unspecified hyperlipidemia type - Plan: simvastatin (ZOCOR) 20 MG tablet  Essential hypertension - Plan: amLODipine (NORVASC) 2.5 MG tablet   DASH Eating Plan DASH stands for "Dietary Approaches to Stop Hypertension." The DASH eating plan is a healthy eating plan that has been shown to reduce high blood pressure (hypertension). It may also reduce your risk for type 2 diabetes, heart disease, and stroke. The DASH eating plan may also help with weight loss. What are tips for following this plan? General guidelines  Avoid eating more than 2,300 mg (milligrams) of salt (sodium) a day. If you have hypertension, you may need to reduce your sodium intake to 1,500 mg a day.  Limit alcohol intake to no more than 1 drink a day for nonpregnant women and 2 drinks a day for men. One drink equals 12 oz of beer, 5 oz of wine, or 1 oz of hard liquor.  Work with your health care provider to maintain a healthy body weight or to lose weight. Ask what an ideal weight is for you.  Get at least 30 minutes of exercise that causes your heart to beat faster (aerobic exercise) most days of the week. Activities may include walking, swimming, or biking.  Work with your health care provider or diet and nutrition specialist (dietitian) to adjust your eating plan to your individual calorie needs. Reading food labels  Check food labels for the amount of sodium per serving. Choose foods with less than 5 percent of the Daily Value of sodium. Generally, foods with less than 300 mg of sodium per serving fit into this eating plan.  To find whole grains, look for the word "whole" as the first word in the ingredient list. Shopping  Buy products labeled as "low-sodium" or "no salt added."  Buy fresh foods. Avoid canned foods and premade or frozen meals. Cooking  Avoid adding salt when cooking. Use salt-free seasonings or herbs instead of table salt or sea  salt. Check with your health care provider or pharmacist before using salt substitutes.  Do not fry foods. Cook foods using healthy methods such as baking, boiling, grilling, and broiling instead.  Cook with heart-healthy oils, such as olive, canola, soybean, or sunflower oil. Meal planning   Eat a balanced diet that includes: ? 5 or more servings of fruits and vegetables each day. At each meal, try to fill half of your plate with fruits and vegetables. ? Up to 6-8 servings of whole grains each day. ? Less than 6 oz of lean meat, poultry, or fish each day. A 3-oz serving of meat is about the same size as a deck of cards. One egg equals 1 oz. ? 2 servings of low-fat dairy each day. ? A serving of nuts, seeds, or beans 5 times each week. ? Heart-healthy fats. Healthy fats called Omega-3 fatty acids are found in foods such as flaxseeds and coldwater fish, like sardines, salmon, and mackerel.  Limit how much you eat of the following: ? Canned or prepackaged foods. ? Food that is high in trans fat, such as fried foods. ? Food that is high in saturated fat, such as fatty meat. ? Sweets, desserts, sugary drinks, and other foods with added sugar. ? Full-fat dairy products.  Do not salt foods before eating.  Try to eat at least 2 vegetarian meals each week.  Eat more home-cooked food and less restaurant, buffet, and fast food.  When eating at a restaurant,  ask that your food be prepared with less salt or no salt, if possible. What foods are recommended? The items listed may not be a complete list. Talk with your dietitian about what dietary choices are best for you. Grains Whole-grain or whole-wheat bread. Whole-grain or whole-wheat pasta. Brown rice. [place]row. Bulgur. Whole-grain and low-sodium cereals. Pita bread. Low-fat, low-sodium crackers. Whole-wheat flour tortillas. Vegetables Fresh or frozen vegetables (raw, steamed, roasted, or grilled). Low-sodium or reduced-sodium tomato  and vegetable juice. Low-sodium or reduced-sodium tomato sauce and tomato paste. Low-sodium or reduced-sodium canned vegetables. Fruits All fresh, dried, or frozen fruit. Canned fruit in natural juice (without added sugar). Meat and other protein foods Skinless chicken or [place]. Ground chicken or [place]. Pork with fat trimmed off. Fish and seafood. Egg whites. Dried beans, peas, or lentils. Unsalted nuts, nut butters, and seeds. Unsalted canned beans. Lean cuts of beef with fat trimmed off. Low-sodium, lean deli meat. Dairy Low-fat (1%) or fat-free (skim) milk. Fat-free, low-fat, or reduced-fat cheeses. Nonfat, low-sodium ricotta or cottage cheese. Low-fat or nonfat yogurt. Low-fat, low-sodium cheese. Fats and oils Soft margarine without trans fats. Vegetable oil. Low-fat, reduced-fat, or light mayonnaise and salad dressings (reduced-sodium). Canola, safflower, olive, soybean, and sunflower oils. Avocado. Seasoning and other foods Herbs. Spices. Seasoning mixes without salt. Unsalted popcorn and pretzels. Fat-free sweets. What foods are not recommended? The items listed may not be a complete list. Talk with your dietitian about what dietary choices are best for you. Grains Baked goods made with fat, such as croissants, muffins, or some breads. Dry pasta or rice meal packs. Vegetables Creamed or fried vegetables. Vegetables in a cheese sauce. Regular canned vegetables (not low-sodium or reduced-sodium). Regular canned tomato sauce and paste (not low-sodium or reduced-sodium). Regular tomato and vegetable juice (not low-sodium or reduced-sodium). Angie Fava. Olives. Fruits Canned fruit in a light or heavy syrup. Fried fruit. Fruit in cream or butter sauce. Meat and other protein foods Fatty cuts of meat. Ribs. Fried meat. Berniece Salines. Sausage. Bologna and other processed lunch meats. Salami. Fatback. Hotdogs. Bratwurst. Salted nuts and seeds. Canned beans with added salt. Canned or smoked fish. Whole eggs  or egg yolks. Chicken or [place] with skin. Dairy Whole or 2% milk, cream, and half-and-half. Whole or full-fat cream cheese. Whole-fat or sweetened yogurt. Full-fat cheese. Nondairy creamers. Whipped toppings. Processed cheese and cheese spreads. Fats and oils Butter. Stick margarine. Lard. Shortening. Ghee. Bacon fat. Tropical oils, such as coconut, palm kernel, or palm oil. Seasoning and other foods Salted popcorn and pretzels. Onion salt, garlic salt, seasoned salt, table salt, and sea salt. Worcestershire sauce. Tartar sauce. Barbecue sauce. Teriyaki sauce. Soy sauce, including reduced-sodium. Steak sauce. Canned and packaged gravies. Fish sauce. Oyster sauce. Cocktail sauce. Horseradish that you find on the shelf. Ketchup. Mustard. Meat flavorings and tenderizers. Bouillon cubes. Hot sauce and Tabasco sauce. Premade or packaged marinades. Premade or packaged taco seasonings. Relishes. Regular salad dressings. Where to find more information:  National Heart, Lung, and [place]: https://wilson-eaton.com/  American Heart Association: www.heart.org Summary  The DASH eating plan is a healthy eating plan that has been shown to reduce high blood pressure (hypertension). It may also reduce your risk for type 2 diabetes, heart disease, and stroke.  With the DASH eating plan, you should limit salt (sodium) intake to 2,300 mg a day. If you have hypertension, you may need to reduce your sodium intake to 1,500 mg a day.  When on the DASH eating plan, aim to eat more fresh  fruits and vegetables, whole grains, lean proteins, low-fat dairy, and heart-healthy fats.  Work with your health care provider or diet and nutrition specialist (dietitian) to adjust your eating plan to your individual calorie needs. This information is not intended to replace advice given to you by your health care provider. Make sure you discuss any questions you have with your health care provider. Document Released: 12/25/2010  Document Revised: 12/30/2015 Document Reviewed: 12/30/2015 Elsevier Interactive Patient Education  [employer].  Please be sure medication list is accurate. If a new problem present, please set up appointment sooner than planned today.

## 2017-04-05 NOTE — Assessment & Plan Note (Signed)
Last lipid panel mildly abnormal, TG mainly. I recommend continuing Simvastatin 20 mg daily. Continue low-fat diet. We will continue following annually.

## 2017-04-05 NOTE — Assessment & Plan Note (Signed)
Adequately controlled. No changes in current management. Low-salt diet to continue. Eye exam recommended annually. Because she is following with other providers every 3-4 months at home, I think it is appropriate to follow annually,  Before if needed.

## 2017-04-05 NOTE — Assessment & Plan Note (Signed)
E GFR has been stable. We discussed lab results for the past 2 years. Recommend continuing low-salt diet, avoiding NSAIDs. Adequate BP control and hydration. I think it is appropriate to follow annually.

## 2017-04-05 NOTE — Assessment & Plan Note (Signed)
Her daughter is not sure about next follow-up appointment, pending call to pick up eyeglasses. Some side effects of systemic steroids discussed. She will continue following with ophthalmologist.

## 2017-04-08 ENCOUNTER — Telehealth: Payer: Self-pay

## 2017-04-08 NOTE — Telephone Encounter (Signed)
Copied from [place]. Topic: General - Other >> Apr 08, 2017  4:13 PM Carolyn Stare wrote:  [name] with [facility] would like a call back concerning pt eye pressure and putting her on drops. But would like to verify since pt is on other medicines   [phone]

## 2017-04-09 NOTE — Telephone Encounter (Signed)
Left message for [name] to give me a call back to verify meds.

## 2017-04-09 NOTE — Telephone Encounter (Signed)
Spoke with [name], gave instructions per Dr. Martinique concerning eye drops. Gretchen verbalized understanding.

## 2017-04-09 NOTE — Telephone Encounter (Signed)
I think it is appropriate to start BB eye drops to treat glaucoma unless a specific contraindication. Medications she is taking currently usually to not interact with topical beta-blockers.  Thanks, BJ

## 2017-04-09 NOTE — Telephone Encounter (Signed)
Spoke with [name], she wanted to know if it was okay to prescribe a beta-blocker eye drop for patient because her pressures were so high the other day. Dr. Martinique, please advise so that I can call [name] back on today.

## 2017-04-22 NOTE — Telephone Encounter (Signed)
Pending prior authorization for hyalgan

## 2017-05-20 ENCOUNTER — Ambulatory Visit (INDEPENDENT_AMBULATORY_CARE_PROVIDER_SITE_OTHER): Payer: Medicare Other | Admitting: Family Medicine

## 2017-05-20 ENCOUNTER — Encounter: Payer: Self-pay | Admitting: Family Medicine

## 2017-05-20 ENCOUNTER — Telehealth (INDEPENDENT_AMBULATORY_CARE_PROVIDER_SITE_OTHER): Payer: Self-pay | Admitting: Physical Medicine and Rehabilitation

## 2017-05-20 ENCOUNTER — Telehealth: Payer: Self-pay

## 2017-05-20 VITALS — BP 118/64 | HR 65 | Temp 98.3°F | Ht 59.0 in | Wt 140.4 lb

## 2017-05-20 DIAGNOSIS — H6981 Other specified disorders of Eustachian tube, right ear: Secondary | ICD-10-CM | POA: Diagnosis not present

## 2017-05-20 DIAGNOSIS — J310 Chronic rhinitis: Secondary | ICD-10-CM | POA: Diagnosis not present

## 2017-05-20 DIAGNOSIS — H65191 Other acute nonsuppurative otitis media, right ear: Secondary | ICD-10-CM | POA: Diagnosis not present

## 2017-05-20 NOTE — Telephone Encounter (Signed)
yes

## 2017-05-20 NOTE — Progress Notes (Signed)
  HPI:  Using dictation device. Unfortunately this device frequently misinterprets words/phrases.  Acute visit for R ear discomfort: -x3-4 days -intermittent -no fevers, malaise, significant sinus issues, increased hear loss, tinnitus  ROS: See pertinent positives and negatives per HPI.  Past Medical History:  Diagnosis Date  . Arthritis   . Diverticulitis   . History of fainting spells of unknown cause   . Hypercholesteremia   . Hypertension   . Pulmonary emboli [facility])     Past Surgical History:  Procedure Laterality Date  . APPENDECTOMY    . CARPAL TUNNEL RELEASE    . HERNIA REPAIR  2011   mesh removed  . TONSILLECTOMY      Family History  Problem Relation Age of Onset  . Diabetes Mother   . Diabetes Father     SOCIAL HX: see hpi   Current Outpatient Medications:  .  amLODipine (NORVASC) 2.5 MG tablet, Take 1 tablet (2.5 mg total) by mouth daily., Disp: 90 tablet, Rfl: 3 .  CALCIUM PO, Take 1 tablet by mouth daily., Disp: , Rfl:  .  NON FORMULARY, [facility]  Onychomycosis Nail Lacquer -  Fluconazole 2%, Terbinafine 1% DMSO Apply to affected nail once daily Qty. 120 gm 3 refills, Disp: , Rfl:  .  simvastatin (ZOCOR) 20 MG tablet, Take 1 tablet (20 mg total) by mouth daily., Disp: 90 tablet, Rfl: 3  EXAM:  Vitals:   05/20/17 1526  BP: 118/64  Pulse: 65  Temp: 98.3 F (36.8 C)    Body mass index is 28.36 kg/m.  GENERAL: vitals reviewed and listed above, alert, oriented, appears well hydrated and in no acute distress  HEENT: atraumatic, conjunttiva clear, no obvious abnormalities on inspection of external nose and ears,normal appearance of ear canals and TMs except for clear effusion on the R, copious amounts of clear nasal congestion, boggy turbinates, mild post oropharyngeal erythema with PND, no tonsillar edema or exudate, no sinus TTP  NECK: no obvious masses on inspection  LUNGS: clear to auscultation bilaterally, no wheezes, rales or rhonchi,  good air movement  CV: HRRR, no peripheral edema  MS: moves all extremities without noticeable abnormality  PSYCH: pleasant and cooperative, no obvious depression or anxiety  ASSESSMENT AND PLAN:  Discussed the following assessment and plan:  Rhinitis, unspecified type  Dysfunction of right eustachian tube  Acute effusion of right ear  -suspect ETD as cause of discomfort and nonpurulent effusion R ear, likely 2ndary to mild VURI or AR -she has glaucoma so will avoid afrin and INS -nasal saline and antihistamine instead -Patient advised to return or notify a doctor immediately if symptoms worsen or persist or new concerns arise.  They declined AVS. There are no Patient Instructions on file for this visit.  Lucretia Kern, DO

## 2017-05-20 NOTE — Telephone Encounter (Signed)
Scheduled for 05/27/17 at 1530 with driver and no blood thinners.

## 2017-05-20 NOTE — Telephone Encounter (Signed)
That is fine with me.

## 2017-05-20 NOTE — Telephone Encounter (Signed)
Copied from [place] [id number]. Topic: Inquiry >> May 20, 2017  1:16 PM Pricilla Handler wrote: Reason for CRM: Patient's daughter states that she requested a transfer of care from Dr. Martinique to one of the other providers in the office back in March, but it was never done. Patient's daughter woulkd like to transfer care from Dr. Martinique to one of the other Doctors in our practice.   Dr. Martinique - Pt would like to transfer care to Dr. Volanda Napoleon. Please advise. Thanks!

## 2017-05-24 NOTE — Telephone Encounter (Signed)
Dr. Volanda Napoleon - Pt would like to transfer care to you. Thanks!

## 2017-05-27 ENCOUNTER — Ambulatory Visit (INDEPENDENT_AMBULATORY_CARE_PROVIDER_SITE_OTHER): Payer: Self-pay

## 2017-05-27 ENCOUNTER — Ambulatory Visit (INDEPENDENT_AMBULATORY_CARE_PROVIDER_SITE_OTHER): Payer: Medicare Other | Admitting: Physical Medicine and Rehabilitation

## 2017-05-27 ENCOUNTER — Encounter (INDEPENDENT_AMBULATORY_CARE_PROVIDER_SITE_OTHER): Payer: Self-pay | Admitting: Physical Medicine and Rehabilitation

## 2017-05-27 VITALS — BP 174/88 | HR 65 | Temp 98.1°F

## 2017-05-27 DIAGNOSIS — M5416 Radiculopathy, lumbar region: Secondary | ICD-10-CM | POA: Diagnosis not present

## 2017-05-27 DIAGNOSIS — M5441 Lumbago with sciatica, right side: Secondary | ICD-10-CM | POA: Diagnosis not present

## 2017-05-27 DIAGNOSIS — M5442 Lumbago with sciatica, left side: Secondary | ICD-10-CM

## 2017-05-27 DIAGNOSIS — M48062 Spinal stenosis, lumbar region with neurogenic claudication: Secondary | ICD-10-CM

## 2017-05-27 DIAGNOSIS — M419 Scoliosis, unspecified: Secondary | ICD-10-CM | POA: Diagnosis not present

## 2017-05-27 DIAGNOSIS — G8929 Other chronic pain: Secondary | ICD-10-CM

## 2017-05-27 MED ORDER — BETAMETHASONE SOD PHOS & ACET 6 (3-3) MG/ML IJ SUSP
12.0000 mg | Freq: Once | INTRAMUSCULAR | Status: AC
  Administered 2017-05-27: 12 mg

## 2017-05-27 NOTE — Progress Notes (Signed)
.  Numeric Pain Rating Scale and Functional Assessment Average Pain 6   In the last MONTH (on 0-10 scale) has pain interfered with the following?  1. General activity like being  able to carry out your everyday physical activities such as walking, climbing stairs, carrying groceries, or moving a chair?  Rating(5)   +Driver, -BT, -Dye Allergies.  

## 2017-05-27 NOTE — Patient Instructions (Signed)

## 2017-06-01 NOTE — Telephone Encounter (Signed)
ok 

## 2017-06-07 ENCOUNTER — Ambulatory Visit: Payer: Medicare Other | Admitting: Family Medicine

## 2017-06-07 DIAGNOSIS — Z0289 Encounter for other administrative examinations: Secondary | ICD-10-CM

## 2017-06-09 NOTE — Telephone Encounter (Signed)
Pt has been advised and will call to schedule.

## 2017-06-11 NOTE — Procedures (Signed)
Lumbosacral Transforaminal Epidural Steroid Injection - Sub-Pedicular Approach with Fluoroscopic Guidance  Patient: Deborah Bell      Date of Birth: 1928/03/20 MRN: [id number] PCP: Martinique, Betty G, MD      Visit Date: 05/27/2017   Universal Protocol:    Date/Time: 05/27/2017  Consent Given By: the patient  Position: PRONE  Additional Comments: Vital signs were monitored before and after the procedure. Patient was prepped and draped in the usual sterile fashion. The correct patient, procedure, and site was verified.   Injection Procedure Details:  Procedure Site One Meds Administered:  Meds ordered this encounter  Medications  . betamethasone acetate-betamethasone sodium phosphate (CELESTONE) injection 12 mg    Laterality: Bilateral  Location/Site:  L3-L4  Needle size: 22 G  Needle type: Spinal  Needle Placement: Transforaminal  Findings:    -Comments: Excellent flow of contrast along the nerve and into the epidural space.  Procedure Details: After squaring off the end-plates to get a true AP view, the C-arm was positioned so that an oblique view of the foramen as noted above was visualized. The target area is just inferior to the "nose of the scotty dog" or sub pedicular. The soft tissues overlying this structure were infiltrated with 2-3 ml. of 1% Lidocaine without Epinephrine.  The spinal needle was inserted toward the target using a "trajectory" view along the fluoroscope beam.  Under AP and lateral visualization, the needle was advanced so it did not puncture dura and was located close the 6 O'Clock position of the pedical in AP tracterory. Biplanar projections were used to confirm position. Aspiration was confirmed to be negative for CSF and/or blood. A 1-2 ml. volume of Isovue-250 was injected and flow of contrast was noted at each level. Radiographs were obtained for documentation purposes.   After attaining the desired flow of contrast documented above, a  0.5 to 1.0 ml test dose of 0.25% Marcaine was injected into each respective transforaminal space.  The patient was observed for 90 seconds post injection.  After no sensory deficits were reported, and normal lower extremity motor function was noted,   the above injectate was administered so that equal amounts of the injectate were placed at each foramen (level) into the transforaminal epidural space.   Additional Comments:  The patient tolerated the procedure well Dressing: Band-Aid    Post-procedure details: Patient was observed during the procedure. Post-procedure instructions were reviewed.  Patient left the clinic in stable condition.

## 2017-06-11 NOTE — Progress Notes (Signed)
Deborah Bell - 84 y.o. female MRN [id number]  Date of birth: [birth year]  Office Visit Note: Visit Date: 05/27/2017 PCP: Martinique, Betty G, MD Referred by: Martinique, Betty G, MD  Subjective: Chief Complaint  Patient presents with  . Lower Back - Pain   HPI: Deborah Bell is a pleasant 84 year old female accompanied by her daughter who provides a lot of the history.  She is a patient of Dr. Durward Fortes to have seen on 2 occasions.  We completed sacroiliac joint injection at his request without much relief and this was about a year ago.  We then completed interlaminar epidural steroid injection with great relief for 2 months and then a bilateral L3 transforaminal injection that really helped from December up until just recently.  An MRI had been updated at that point that did show severe stenosis at L3-4.[name]  Her symptoms have worsened over the last few months without trauma or weakness.  When we saw her last time she was reporting bilateral leg pain with paresthesia.  Her symptoms are really mainly in the right leg with tingling at this point.  No focal weakness or foot drop.  She does get pain across the lower back.  We talked at length with her daughter about numbers of injections and how those are repeated.  While there is because she is also being treated for her knee by Dr. Durward Fortes and we have to consider that as well I did tell her that obviously these are repeatable we cannot see from an individual basis how the person is doing.  Obviously if they are not lasting very long than it something we have to look at.  We will repeat the bilateral L3 injections today.   ROS Otherwise per HPI.  Assessment & Plan: Visit Diagnoses:  1. Radiculopathy, lumbar region   2. Spinal stenosis of lumbar region with neurogenic claudication   3. Chronic midline low back pain with bilateral sciatica   4. Scoliosis of lumbar spine, unspecified scoliosis type     Plan: No additional findings.   Meds &  Orders:  Meds ordered this encounter  Medications  . betamethasone acetate-betamethasone sodium phosphate (CELESTONE) injection 12 mg    Orders Placed This Encounter  Procedures  . XR C-ARM NO REPORT  . Epidural Steroid injection    Follow-up: Return if symptoms worsen or fail to improve.   Procedures: No procedures performed  Lumbosacral Transforaminal Epidural Steroid Injection - Sub-Pedicular Approach with Fluoroscopic Guidance  Patient: Deborah Bell      Date of Birth: 1928/07/01 MRN: [id number] PCP: Martinique, Betty G, MD      Visit Date: 05/27/2017   Universal Protocol:    Date/Time: 05/27/2017  Consent Given By: the patient  Position: PRONE  Additional Comments: Vital signs were monitored before and after the procedure. Patient was prepped and draped in the usual sterile fashion. The correct patient, procedure, and site was verified.   Injection Procedure Details:  Procedure Site One Meds Administered:  Meds ordered this encounter  Medications  . betamethasone acetate-betamethasone sodium phosphate (CELESTONE) injection 12 mg    Laterality: Bilateral  Location/Site:  L3-L4  Needle size: 22 G  Needle type: Spinal  Needle Placement: Transforaminal  Findings:    -Comments: Excellent flow of contrast along the nerve and into the epidural space.  Procedure Details: After squaring off the end-plates to get a true AP view, the C-arm was positioned so that an oblique view of the foramen as noted above was  visualized. The target area is just inferior to the "nose of the scotty dog" or sub pedicular. The soft tissues overlying this structure were infiltrated with 2-3 ml. of 1% Lidocaine without Epinephrine.  The spinal needle was inserted toward the target using a "trajectory" view along the fluoroscope beam.  Under AP and lateral visualization, the needle was advanced so it did not puncture dura and was located close the 6 O'Clock position of the pedical in  AP tracterory. Biplanar projections were used to confirm position. Aspiration was confirmed to be negative for CSF and/or blood. A 1-2 ml. volume of Isovue-250 was injected and flow of contrast was noted at each level. Radiographs were obtained for documentation purposes.   After attaining the desired flow of contrast documented above, a 0.5 to 1.0 ml test dose of 0.25% Marcaine was injected into each respective transforaminal space.  The patient was observed for 90 seconds post injection.  After no sensory deficits were reported, and normal lower extremity motor function was noted,   the above injectate was administered so that equal amounts of the injectate were placed at each foramen (level) into the transforaminal epidural space.   Additional Comments:  The patient tolerated the procedure well Dressing: Band-Aid    Post-procedure details: Patient was observed during the procedure. Post-procedure instructions were reviewed.  Patient left the clinic in stable condition.    Clinical History: MRI LUMBAR SPINE WITHOUT CONTRAST  TECHNIQUE: Multiplanar, multisequence MR imaging of the lumbar spine was performed. No intravenous contrast was administered.  COMPARISON:  Plain films 02/04/2016.  FINDINGS: Segmentation:  Standard.  Alignment: Severe degenerative scoliosis convex RIGHT related to loss of interspace height on the LEFT at L3-4 and L2-3. Leftward translation L1 on L2. Trace anterolisthesis L3-L4. As correlated with plain films, scoliosis measures greater than 30 degrees.  Vertebrae: Other than endplate reactive changes most prominent at L3-4, no worrisome or significant osseous findings.  Conus medullaris and cauda equina: Conus extends to the L1 level. Conus and cauda equina appear normal.  Paraspinal and other soft tissues: BILATERAL renal cystic disease incompletely evaluated.  Disc levels:  L1-L2: Central protrusion. Posterior element hypertrophy,  slightly worse on the LEFT. LEFT greater than RIGHT L2 nerve root impingement. LEFT L1 nerve root impingement due to foraminal narrowing. Mild to moderate stenosis is superimposed.  L2-L3: Annular bulge. Posterior element hypertrophy. Borderline stenosis. Asymmetric loss of interspace height on the LEFT. LEFT L2 and L3 nerve root impingement.  L3-L4: Near complete loss of interspace height at L3-4 on the LEFT. Marked osseous spurring. Posterior element hypertrophy. Central and leftward protrusion is superimposed. Trace anterolisthesis. Moderate to severe stenosis. Severe LEFT L3 and L4 nerve root impingement.  L4-L5: Good disc height and hydration. Posterior element hypertrophy. Annular bulge. Mild stenosis. Borderline subarticular zone narrowing on the RIGHT could affect the L5 nerve root.  L5-S1: Disc desiccation. Annular bulge. Facet arthropathy. No impingement.  IMPRESSION: The dominant abnormality is at L3-L4 where near complete loss of interspace height on the LEFT contributes to significant dextroconvex scoliosis (greater than 30 degrees), and bony overgrowth. Asymmetric posterior element hypertrophy along with leftward disc protrusion results in moderate to severe stenosis and LEFT L3/LEFT L4 nerve root impingement.  Similar less severe changes at L2-3 on the LEFT, with asymmetric loss of interspace is height in conjunction with annular bulge and posterior element hypertrophy result in L2 and L3 nerve root impingement at that level.  Leftward translation L1 on L2. Central protrusion with posterior element hypertrophy. Correlate  clinically for symptomatic LEFT L1 and LEFT L2 nerve root impingement.  Marked BILATERAL renal cystic disease, incompletely evaluated. If further investigation desired, consider sonography.   Electronically Signed   By: Staci Righter M.D.   On: 12/20/2016 07:56  02/04/2016 Lspine 4v Xray  AP and lateral lumbar spine x-ray shows  moderate right-sided scoliosis  with a dextro rotatory component centered at about the L3-L4 region. There  is some rotation of the pelvis with some degenerative changes of the right  sacroiliac joint with fairly normal-appearing left sacroiliac joint. Hip  joints appear to show only mild degenerative changes with some decent  joint spaces. There are no fractures or dislocations.   She reports that she has never smoked. She has never used smokeless tobacco. No results for input(s): HGBA1C, LABURIC in the last 8760 hours.  Objective:  VS:  HT:    WT:   BMI:     BP:(!) 174/88  HR:65bpm  TEMP:98.1 F (36.7 C)( )  RESP:97 % Physical Exam  Ortho Exam Imaging: No results found.  Past Medical/Family/Surgical/Social History: Medications & Allergies reviewed per EMR, new medications updated. Patient Active Problem List   Diagnosis Date Noted  . CKD (chronic kidney disease) stage 3, GFR 30-59 ml/min (HCC) 04/05/2017  . Glaucoma 04/05/2017  . Obesity, Class I, BMI 30-34.9 05/08/2016  . Incisional hernia of anterior abdominal wall without obstruction or gangrene 05/08/2016  . Hyperlipidemia 05/07/2016  . Chronic pain disorder 10/29/2015  . Essential hypertension 10/29/2015  . Unstable gait 10/29/2015  . Osteoarthritis of left knee 10/29/2015  . Vertigo 10/29/2015  . Low back pain with radiation, unspecified laterality 10/29/2015   Past Medical History:  Diagnosis Date  . Arthritis   . Diverticulitis   . History of fainting spells of unknown cause   . Hypercholesteremia   . Hypertension   . Pulmonary emboli (HCC)    Family History  Problem Relation Age of Onset  . Diabetes Mother   . Diabetes Father    Past Surgical History:  Procedure Laterality Date  . APPENDECTOMY    . CARPAL TUNNEL RELEASE    . HERNIA REPAIR  2011   mesh removed  . TONSILLECTOMY     Social History   Occupational History  . Not on file  Tobacco Use  . Smoking status: Never Smoker  . Smokeless  tobacco: Never Used  Substance and Sexual Activity  . Alcohol use: No  . Drug use: No  . Sexual activity: Not Currently

## 2017-06-30 ENCOUNTER — Ambulatory Visit (INDEPENDENT_AMBULATORY_CARE_PROVIDER_SITE_OTHER): Payer: Medicare Other | Admitting: Family Medicine

## 2017-06-30 ENCOUNTER — Encounter: Payer: Self-pay | Admitting: Family Medicine

## 2017-06-30 ENCOUNTER — Encounter

## 2017-06-30 VITALS — BP 138/78 | HR 72 | Temp 97.5°F | Ht 59.0 in | Wt 142.0 lb

## 2017-06-30 DIAGNOSIS — I10 Essential (primary) hypertension: Secondary | ICD-10-CM | POA: Diagnosis not present

## 2017-06-30 DIAGNOSIS — E785 Hyperlipidemia, unspecified: Secondary | ICD-10-CM

## 2017-06-30 DIAGNOSIS — H9190 Unspecified hearing loss, unspecified ear: Secondary | ICD-10-CM

## 2017-06-30 NOTE — Progress Notes (Signed)
Subjective:    Patient ID: Deborah Bell, female    DOB: 07/07/1928, 84 y.o.   MRN: [id number]  No chief complaint on file. Pt is accompanied by her daughter.  Pt speaks Spanish, but understands some [place].   HPI Patient was seen today for transfer of care and to f/u on chronic issues.  HTN: -taking norvasc 2.5 mg daily -denies weakness, dizziness, or LE edema  HLD: -taking simvastatin 20 mg daily -denies myalgias.  Hearing concern per pt's daughter. Daughter feels pt does not always hear what she is saying.  Reports recurrent ear infections in the past.  Was on an abx ear gtt by previous out of state provider.  Prior to moving in with daughter, pt lived in [place] with her husband who recently died.  Daughter states she needs paperwork filled out so pt can receive part of her husband's benefits.  She did not bring the paperwork with her.  Past Medical History:  Diagnosis Date  . Arthritis   . Diverticulitis   . History of fainting spells of unknown cause   . Hypercholesteremia   . Hypertension   . Pulmonary emboli (HCC)     No Known Allergies  ROS General: Denies fever, chills, night sweats, changes in weight, changes in appetite HEENT: Denies headaches, ear pain, changes in vision, rhinorrhea, sore throat  +decreased hearing CV: Denies CP, palpitations, SOB, orthopnea Pulm: Denies SOB, cough, wheezing GI: Denies abdominal pain, nausea, vomiting, diarrhea, constipation GU: Denies dysuria, hematuria, frequency, vaginal discharge Msk: Denies muscle cramps, joint pains Neuro: Denies weakness, numbness, tingling Skin: Denies rashes, bruising Psych: Denies depression, anxiety, hallucinations     Objective:    Blood pressure 138/78, pulse 72, temperature (!) 97.5 F (36.4 C), temperature source Oral, height 4\' 11"  (1.499 m), weight 142 lb (64.4 kg), SpO2 97 %. Hearing test.  Right passed. L heard all but 4000 Hz.  Gen. Pleasant, well-nourished, in no distress,  normal affect  HEENT: [place]/AT, face symmetric, no scleral icterus, PERRLA, nares patent without drainage, pharynx without erythema or exudate. Lungs: no accessory muscle use, CTAB, no wheezes or rales Cardiovascular: RRR, no m/r/g, no peripheral edema Abdomen: BS present, soft, NT/ND Neuro:  A&Ox3, CN II-XII intact, normal gait   Wt Readings from Last 3 Encounters:  06/30/17 142 lb (64.4 kg)  05/20/17 140 lb 6.4 oz (63.7 kg)  04/05/17 141 lb (64 kg)    Lab Results  Component Value Date   WBC 5.7 10/26/2015   HGB 11.6 (L) 10/26/2015   HCT 37.0 10/26/2015   PLT 213 10/26/2015   GLUCOSE 87 03/25/2017   CHOL 197 03/25/2017   TRIG 162.0 (H) 03/25/2017   HDL 47.50 03/25/2017   LDLCALC 117 (H) 03/25/2017   ALT 16 03/25/2017   AST 22 03/25/2017   NA 145 03/25/2017   K 3.7 03/25/2017   CL 105 03/25/2017   CREATININE 1.05 03/25/2017   BUN 19 03/25/2017   CO2 31 03/25/2017   INR 2.00 10/26/2015    Assessment/Plan:  Essential hypertension -controlled -continue norvasc 2.5 mg daily -encouraged to check bp at home and keep a log.  Hyperlipidemia, unspecified hyperlipidemia type -continue simvastatin 20 mg daily -continue lifestyle modifications  Hearing difficulty, unspecified laterality -hearing tested in office. -R ear passed, L ear able to hear all but 4000 Hz. -discussed referral to Audiologist.  Pt's daughter requesting referral to ENT.  F/u prn  Grier Mitts, MD

## 2017-06-30 NOTE — Patient Instructions (Signed)
Hearing Loss Hearing loss is a partial or total loss of the ability to hear. This can be temporary or permanent, and it can happen in one or both ears. Hearing loss may be referred to as deafness. Medical care is necessary to treat hearing loss properly and to prevent the condition from getting worse. Your hearing may partially or completely come back, depending on what caused your hearing loss and how severe it is. In some cases, hearing loss is permanent. What are the causes? Common causes of hearing loss include:  Too much wax in the ear canal.  Infection of the ear canal or middle ear.  Fluid in the middle ear.  Injury to the ear or surrounding area.  An object stuck in the ear.  Prolonged exposure to loud sounds, such as music.  Less common causes of hearing loss include:  Tumors in the ear.  Viral or bacterial infections, such as meningitis.  A hole in the eardrum (perforated eardrum).  Problems with the hearing nerve that sends signals between the brain and the ear.  Certain medicines.  What are the signs or symptoms? Symptoms of this condition may include:  Difficulty telling the difference between sounds.  Difficulty following a conversation when there is background noise.  Lack of response to sounds in your environment. This may be most noticeable when you do not respond to startling sounds.  Needing to turn up the volume on the television, radio, etc.  Ringing in the ears.  Dizziness.  Pain in the ears.  How is this diagnosed? This condition is diagnosed based on a physical exam and a hearing test (audiometry). The audiometry test will be performed by a hearing specialist (audiologist). You may also be referred to an ear, nose, and throat (ENT) specialist (otolaryngologist). How is this treated? Treatment for recent onset of hearing loss may include:  Ear wax removal.  Being prescribed medicines to prevent infection (antibiotics).  Being prescribed  medicines to reduce inflammation (corticosteroids).  Follow these instructions at home:  If you were prescribed an antibiotic medicine, take it as told by your health care provider. Do not stop taking the antibiotic even if you start to feel better.  Take over-the-counter and prescription medicines only as told by your health care provider.  Avoid loud noises.  Return to your normal activities as told by your health care provider. Ask your health care provider what activities are safe for you.  Keep all follow-up visits as told by your health care provider. This is important. Contact a health care provider if:  You feel dizzy.  You develop new symptoms.  You vomit or feel nauseous.  You have a fever. Get help right away if:  You develop sudden changes in your vision.  You have severe ear pain.  You have new or increased weakness.  You have a severe headache. This information is not intended to replace advice given to you by your health care provider. Make sure you discuss any questions you have with your health care provider. Document Released: 01/05/2005 Document Revised: 06/13/2015 Document Reviewed: 05/23/2014 Elsevier Interactive Patient Education  2018 Elsevier Inc.  

## 2017-07-01 ENCOUNTER — Telehealth (INDEPENDENT_AMBULATORY_CARE_PROVIDER_SITE_OTHER): Payer: Self-pay | Admitting: *Deleted

## 2017-07-01 NOTE — Telephone Encounter (Signed)
Ok to repeat 

## 2017-07-20 ENCOUNTER — Telehealth: Payer: Self-pay

## 2017-07-20 ENCOUNTER — Ambulatory Visit (INDEPENDENT_AMBULATORY_CARE_PROVIDER_SITE_OTHER): Payer: Medicare Other | Admitting: Family Medicine

## 2017-07-20 ENCOUNTER — Encounter: Payer: Self-pay | Admitting: Family Medicine

## 2017-07-20 VITALS — BP 146/78 | HR 74 | Temp 98.3°F | Wt 136.0 lb

## 2017-07-20 DIAGNOSIS — R05 Cough: Secondary | ICD-10-CM

## 2017-07-20 DIAGNOSIS — R059 Cough, unspecified: Secondary | ICD-10-CM

## 2017-07-20 LAB — CBC WITH DIFFERENTIAL/PLATELET
BASOS ABS: 0.1 10*3/uL (ref 0.0–0.1)
Basophils Relative: 0.9 % (ref 0.0–3.0)
EOS PCT: 2.8 % (ref 0.0–5.0)
Eosinophils Absolute: 0.2 10*3/uL (ref 0.0–0.7)
HCT: 40 % (ref 36.0–46.0)
HEMOGLOBIN: 13.2 g/dL (ref 12.0–15.0)
LYMPHS ABS: 2.4 10*3/uL (ref 0.7–4.0)
Lymphocytes Relative: 40.2 % (ref 12.0–46.0)
MCHC: 33.1 g/dL (ref 30.0–36.0)
MCV: 92.7 fl (ref 78.0–100.0)
MONOS PCT: 7.4 % (ref 3.0–12.0)
Monocytes Absolute: 0.4 10*3/uL (ref 0.1–1.0)
NEUTROS PCT: 48.7 % (ref 43.0–77.0)
Neutro Abs: 2.9 10*3/uL (ref 1.4–7.7)
Platelets: 187 10*3/uL (ref 150.0–400.0)
RBC: 4.31 Mil/uL (ref 3.87–5.11)
RDW: 14.2 % (ref 11.5–15.5)
WBC: 6 10*3/uL (ref 4.0–10.5)

## 2017-07-20 LAB — D-DIMER, QUANTITATIVE (NOT AT ARMC): D DIMER QUANT: 3.54 ug{FEU}/mL — AB (ref <0.50)

## 2017-07-20 NOTE — Telephone Encounter (Signed)
Please be advised pt did not mention of any need for hematology referral per dr Volanda Napoleon and that's why its not in her note, and daughter was asking of the ENT referral after the visit but Dr Volanda Napoleon explained to them that she needed to see an Audiologist for her hearing. Pt is on scheduled to see her this afternoon so she will take care of the referral needed.

## 2017-07-20 NOTE — Progress Notes (Signed)
Subjective:    Patient ID: Deborah Bell, female    DOB: 12/23/1928, 84 y.o.   MRN: [id number]  No chief complaint on file. Pt primarily speaks [place].  She is accompanied by her daughter.  HPI Patient was seen today for acute concern.  Pt reports non productive cough and fever (Tmax 101.2) x 2 days.  Pt has not tried anything for her symptoms.  Pt denies sick contacts, rhinorrhea, ST, ear pain/pressure, facial pain/pressure, pain with deep breath, calf/leg edema or pain.  Pt has a h/o PE 2 yrs ago.  Pt denies recent travel.  Pt's daughter stating she never received calls regarding referrals.  At last [place] pt's daughter reminded that she never mentioned a hematology referral to this provider.  Also noted pt had difficulty hearing 4000 Hz in L ear.  Audiology referral was suggested however pt's daughter did not want this.    Past Medical History:  Diagnosis Date  . Arthritis   . Diverticulitis   . History of fainting spells of unknown cause   . Hypercholesteremia   . Hypertension   . Pulmonary emboli (HCC)     No Known Allergies  ROS General: Denies chills, night sweats, changes in weight, changes in appetite  +fever HEENT: Denies headaches, ear pain, changes in vision, rhinorrhea, sore throat CV: Denies CP, palpitations, SOB, orthopnea Pulm: Denies SOB, wheezing  +cough GI: Denies abdominal pain, nausea, vomiting, diarrhea, constipation GU: Denies dysuria, hematuria, frequency, vaginal discharge Msk: Denies muscle cramps, joint pains Neuro: Denies weakness, numbness, tingling Skin: Denies rashes, bruising Psych: Denies depression, anxiety, hallucinations     Objective:    Blood pressure (!) 146/78, pulse 74, temperature 98.3 F (36.8 C), temperature source Oral, weight 136 lb (61.7 kg), SpO2 96 %.   Gen. Pleasant, well-nourished, in no distress, normal affect   HEENT: Leonore/AT, face symmetric, no scleral icterus, PERRLA, nares patent without drainage, pharynx without  erythema or exudate. TMs normal b/l.  No cervical lymphadenopathy. Lungs: no accessory muscle use, CTAB, no wheezes or rales.  No pain with deep inspiration Cardiovascular: RRR, no m/r/g, no peripheral edema.  No TTP of b/l calves or thighs, no erythema or edema. Neuro:  A&Ox3, CN II-XII intact, normal gait Skin:  Warm, no lesions/ rash   Wt Readings from Last 3 Encounters:  07/20/17 136 lb (61.7 kg)  06/30/17 142 lb (64.4 kg)  05/20/17 140 lb 6.4 oz (63.7 kg)    Lab Results  Component Value Date   WBC 5.7 10/26/2015   HGB 11.6 (L) 10/26/2015   HCT 37.0 10/26/2015   PLT 213 10/26/2015   GLUCOSE 87 03/25/2017   CHOL 197 03/25/2017   TRIG 162.0 (H) 03/25/2017   HDL 47.50 03/25/2017   LDLCALC 117 (H) 03/25/2017   ALT 16 03/25/2017   AST 22 03/25/2017   NA 145 03/25/2017   K 3.7 03/25/2017   CL 105 03/25/2017   CREATININE 1.05 03/25/2017   BUN 19 03/25/2017   CO2 31 03/25/2017   INR 2.00 10/26/2015    Assessment/Plan:  Cough  -afebrile in clinic -likely viral.  Given h/o previous PE will obtain labs, though exam and vs reassuring. -wells score 1.5 (h/o PE/DVT) - Plan: CBC with Differential/Platelet, D-dimer, Quantitative  -Given RTC or ED precautions  F/u prn.    After discussion regarding referral for hearing loss pt's daughter states a referral to an Audiologologist seems "ass backwards".  Daughter advised she may need to find a new provider.  Grier Mitts,  MD   

## 2017-07-20 NOTE — Telephone Encounter (Signed)
Pt's daughter (DPR) called to ask about the referrals she had asked for to ENT and Hematology. I do not see any referrals have been placed for pt recently. There is mention in LOV note about ENT referral, but none placed. During course of conversation daughter advised pt previously had issues with multiple PE's with symptoms of leg swelling, redness, warmth, cough and fever. She states pt currently had cough and fever. Advised daughter that pt needs to be seen, scheduled with Dr. Volanda Napoleon today. Advised daughter we would look into referrals.

## 2017-07-20 NOTE — Patient Instructions (Signed)
Cough, Adult  Coughing is a reflex that clears your throat and your airways. Coughing helps to heal and protect your lungs. It is normal to cough occasionally, but a cough that happens with other symptoms or lasts a long time may be a sign of a condition that needs treatment. A cough may last only 2-3 weeks (acute), or it may last longer than 8 weeks (chronic).  What are the causes?  Coughing is commonly caused by:   Breathing in substances that irritate your lungs.   A viral or bacterial respiratory infection.   Allergies.   Asthma.   Postnasal drip.   Smoking.   Acid backing up from the stomach into the esophagus (gastroesophageal reflux).   Certain medicines.   Chronic lung problems, including COPD (or rarely, lung cancer).   Other medical conditions such as heart failure.    Follow these instructions at home:  Pay attention to any changes in your symptoms. Take these actions to help with your discomfort:   Take medicines only as told by your health care provider.  ? If you were prescribed an antibiotic medicine, take it as told by your health care provider. Do not stop taking the antibiotic even if you start to feel better.  ? Talk with your health care provider before you take a cough suppressant medicine.   Drink enough fluid to keep your urine clear or pale yellow.   If the air is dry, use a cold steam vaporizer or humidifier in your bedroom or your home to help loosen secretions.   Avoid anything that causes you to cough at work or at home.   If your cough is worse at night, try sleeping in a semi-upright position.   Avoid cigarette smoke. If you smoke, quit smoking. If you need help quitting, ask your health care provider.   Avoid caffeine.   Avoid alcohol.   Rest as needed.    Contact a health care provider if:   You have new symptoms.   You cough up pus.   Your cough does not get better after 2-3 weeks, or your cough gets worse.   You cannot control your cough with suppressant  medicines and you are losing sleep.   You develop pain that is getting worse or pain that is not controlled with pain medicines.   You have a fever.   You have unexplained weight loss.   You have night sweats.  Get help right away if:   You cough up blood.   You have difficulty breathing.   Your heartbeat is very fast.  This information is not intended to replace advice given to you by your health care provider. Make sure you discuss any questions you have with your health care provider.  Document Released: 07/04/2010 Document Revised: 06/13/2015 Document Reviewed: 03/14/2014  Elsevier Interactive Patient Education  2018 Elsevier Inc.

## 2017-07-21 ENCOUNTER — Emergency Department (HOSPITAL_COMMUNITY): Payer: Medicare Other

## 2017-07-21 ENCOUNTER — Other Ambulatory Visit: Payer: Self-pay

## 2017-07-21 ENCOUNTER — Encounter (HOSPITAL_COMMUNITY): Payer: Self-pay | Admitting: *Deleted

## 2017-07-21 ENCOUNTER — Emergency Department (HOSPITAL_COMMUNITY)
Admission: EM | Admit: 2017-07-21 | Discharge: 2017-07-21 | Disposition: A | Discharge status: Home / Self Care | Payer: Medicare Other | Attending: Emergency Medicine | Admitting: Emergency Medicine

## 2017-07-21 DIAGNOSIS — Z79899 Other long term (current) drug therapy: Secondary | ICD-10-CM | POA: Diagnosis not present

## 2017-07-21 DIAGNOSIS — R7989 Other specified abnormal findings of blood chemistry: Secondary | ICD-10-CM | POA: Diagnosis not present

## 2017-07-21 DIAGNOSIS — N183 Chronic kidney disease, stage 3 (moderate): Secondary | ICD-10-CM | POA: Diagnosis not present

## 2017-07-21 DIAGNOSIS — R05 Cough: Secondary | ICD-10-CM

## 2017-07-21 DIAGNOSIS — N281 Cyst of kidney, acquired: Secondary | ICD-10-CM | POA: Diagnosis not present

## 2017-07-21 DIAGNOSIS — I129 Hypertensive chronic kidney disease with stage 1 through stage 4 chronic kidney disease, or unspecified chronic kidney disease: Secondary | ICD-10-CM | POA: Diagnosis not present

## 2017-07-21 DIAGNOSIS — I700 Atherosclerosis of aorta: Secondary | ICD-10-CM

## 2017-07-21 DIAGNOSIS — R059 Cough, unspecified: Secondary | ICD-10-CM

## 2017-07-21 LAB — I-STAT CHEM 8, ED
BUN: 23 mg/dL (ref 8–23)
Calcium, Ion: 1.02 mmol/L — ABNORMAL LOW (ref 1.15–1.40)
Chloride: 108 mmol/L (ref 98–111)
Creatinine, Ser: 0.9 mg/dL (ref 0.44–1.00)
Glucose, Bld: 111 mg/dL — ABNORMAL HIGH (ref 70–99)
HEMATOCRIT: 40 % (ref 36.0–46.0)
HEMOGLOBIN: 13.6 g/dL (ref 12.0–15.0)
Potassium: 3.7 mmol/L (ref 3.5–5.1)
SODIUM: 142 mmol/L (ref 135–145)
TCO2: 27 mmol/L (ref 22–32)

## 2017-07-21 MED ORDER — IOPAMIDOL (ISOVUE-370) INJECTION 76%
100.0000 mL | Freq: Once | INTRAVENOUS | Status: AC | PRN
  Administered 2017-07-21: 100 mL via INTRAVENOUS

## 2017-07-21 MED ORDER — BENZONATATE 100 MG PO CAPS: 100.0000 mg | ORAL_CAPSULE | Freq: Two times a day (BID) | ORAL | 0 refills | Status: DC

## 2017-07-21 MED ORDER — IOPAMIDOL (ISOVUE-370) INJECTION 76%
INTRAVENOUS | Status: AC
  Filled 2017-07-21: qty 100

## 2017-07-21 NOTE — ED Notes (Signed)
Patient Alert and oriented to baseline. Stable and ambulatory to baseline. Patient verbalized understanding of the discharge instructions.  Patient belongings were taken by the patient and family.

## 2017-07-21 NOTE — ED Provider Notes (Signed)
11:58 PM BP (!) 167/62   Pulse 68   Temp 98 F (36.7 C) (Oral)   Resp 16   SpO2 97%  Sign out from [employer]. Cough x 2-3 days Here for PE rule our. D dimer pos.  if neg d/c cough suppressant   Patient CTA is negative.  No evidence of PE or mass causing cough. I have discussed incidental findings. The patient will be discharged to follow up with Roma Kayser, PA-C 07/21/17 [id number]    Tegeler, Gwenyth Allegra, MD 07/22/17 [phone]

## 2017-07-21 NOTE — ED Notes (Signed)
Patient transported to CT 

## 2017-07-21 NOTE — Discharge Instructions (Addendum)
Contact a health care provider if:  You have new symptoms.  You cough up pus.  Your cough does not get better after 2-3 weeks, or your cough gets worse.  You cannot control your cough with suppressant medicines and you are losing sleep.  You develop pain that is getting worse or pain that is not controlled with pain medicines.  You have a fever.  You have unexplained weight loss.  You have night sweats.  Get help right away if:  You cough up blood.  You have difficulty breathing.  Your heartbeat is very fast.

## 2017-07-21 NOTE — ED Triage Notes (Signed)
Pt in c/o cough for three days, went to urgent care yesterday for evaluation and lab work was obtained, called today and told to come in due to elevated d-dimer, pt denies pain or shortness of breath at this time, denies any recent swelling in her feet or legs, no distress noted at this time

## 2017-07-21 NOTE — ED Provider Notes (Signed)
[facility] EMERGENCY DEPARTMENT Provider Note   CSN: [id number] Arrival date & time: 07/21/17  1325     History   Chief Complaint Chief Complaint  Patient presents with  . Cough    HPI Deborah Bell is a 84 y.o. female.  HPI  Deborah Bell is a 84 y.o. female, with a history of HTN, hypercholesterolemia, and PE, presenting to the ED with a cough for the past 2 to 3 days.  Intermittent fever with T-max 101.3 F.  Patient was seen by her PCP yesterday, clinically diagnosed with a viral infection, but a d-dimer was performed.  This d-dimer was performed because patient's daughter states her previous PE 2 years ago began with a dry cough.  Patient is no longer anticoagulated.  D-dimer returned positive at 3.54. Denies shortness of breath, chest pain, N/V/D, abdominal pain, lower extremity swelling or pain, or any other complaints.  Past Medical History:  Diagnosis Date  . Arthritis   . Diverticulitis   . History of fainting spells of unknown cause   . Hypercholesteremia   . Hypertension   . Pulmonary emboli [facility])     Patient Active Problem List   Diagnosis Date Noted  . CKD (chronic kidney disease) stage 3, GFR 30-59 ml/min (HCC) 04/05/2017  . Glaucoma 04/05/2017  . Obesity, Class I, BMI 30-34.9 05/08/2016  . Incisional hernia of anterior abdominal wall without obstruction or gangrene 05/08/2016  . Hyperlipidemia 05/07/2016  . Chronic pain disorder 10/29/2015  . Essential hypertension 10/29/2015  . Unstable gait 10/29/2015  . Osteoarthritis of left knee 10/29/2015  . Vertigo 10/29/2015  . Low back pain with radiation, unspecified laterality 10/29/2015    Past Surgical History:  Procedure Laterality Date  . APPENDECTOMY    . CARPAL TUNNEL RELEASE    . HERNIA REPAIR  2011   mesh removed  . TONSILLECTOMY       OB History   None      Home Medications    Prior to Admission medications   Medication Sig Start Date End Date Taking?  Authorizing Provider  amLODipine (NORVASC) 2.5 MG tablet Take 1 tablet (2.5 mg total) by mouth daily. 04/05/17   Martin[place], Betty G, MD  CALCIUM PO Take 1 tablet by mouth daily.    [provider]  NON FORMULARY [facility]  Onychomycosis Nail Lacquer -  Fluconazole 2%, Terbinafine 1% DMSO Apply to affected nail once daily Qty. 120 gm 3 refills    [provider]  simvastatin (ZOCOR) 20 MG tablet Take 1 tablet (20 mg total) by mouth daily. 04/05/17   [place], Betty G, MD    Family History Family History  Problem Relation Age of Onset  . Diabetes Mother   . Diabetes Father     Social History Social History   Tobacco Use  . Smoking status: Never Smoker  . Smokeless tobacco: Never Used  Substance Use Topics  . Alcohol use: No  . Drug use: No     Allergies   Patient has no known allergies.   Review of Systems Review of Systems  Constitutional: Positive for chills and fever. Negative for diaphoresis.  Respiratory: Positive for cough. Negative for shortness of breath.   Cardiovascular: Negative for chest pain and leg swelling.  Gastrointestinal: Negative for abdominal pain, diarrhea, nausea and vomiting.  Neurological: Negative for dizziness, syncope and numbness.  All other systems reviewed and are negative.    Physical Exam Updated Vital Signs BP (!) 161/74 (BP  Location: Right Arm)   Pulse 84   Temp 98 F (36.7 C) (Oral)   Resp 20   SpO2 100%   Physical Exam  Constitutional: She appears well-developed and well-nourished. No distress.  HENT:  Head: Normocephalic and atraumatic.  Eyes: Conjunctivae are normal.  Neck: Neck supple.  Cardiovascular: Normal rate, regular rhythm, normal heart sounds and intact distal pulses.  Pulmonary/Chest: Effort normal and breath sounds normal. No respiratory distress.  No increased work of breathing.  Speaks in full sentences without difficulty.  Abdominal: Soft. There is no tenderness. There is no guarding.   Musculoskeletal: She exhibits no edema.  Lymphadenopathy:    She has no cervical adenopathy.  Neurological: She is alert.  Skin: Skin is warm and dry. She is not diaphoretic.  Psychiatric: She has a normal mood and affect. Her behavior is normal.  Nursing note and vitals reviewed.    ED Treatments / Results  Labs (all labs ordered are listed, but only abnormal results are displayed) Labs Reviewed  I-STAT CHEM 8, ED    EKG None  Radiology Dg Chest 2 View  Result Date: 07/21/2017 CLINICAL DATA:  Cough EXAM: CHEST - 2 VIEW COMPARISON:  July 05, 2016 FINDINGS: There is no edema or consolidation. The heart size and pulmonary vascularity are normal. No adenopathy. There is aortic atherosclerosis. There is degenerative change in the thoracic spine. There is lower thoracic levoscoliosis. IMPRESSION: Aortic atherosclerosis.  No edema or consolidation. Aortic Atherosclerosis (ICD10-I70.0). Electronically Signed   By: Lowella Grip III M.D.   On: 07/21/2017 14:30    Procedures Procedures (including critical care time)  Medications Ordered in ED Medications - No data to display   Initial Impression / Assessment and Plan / ED Course  I have reviewed the triage vital signs and the nursing notes.  Pertinent labs & imaging results that were available during my care of the patient were reviewed by me and considered in my medical decision making (see chart for details).     Patient presents with a dry cough for the last 2 to 3 days.  Positive d-dimer. Patient has no shortness of breath, chest pain, peripheral edema or pain. Doubt dissection.   End of shift patient care handoff report given to Margarita Mail, PA-C.  Plan: CT PE study. If normal, discharge home with symptomatic care instructions for cough.  Final Clinical Impressions(s) / ED Diagnoses   Final diagnoses:  None    ED Discharge Orders    None       Layla Maw 07/21/17 1509    Blanchie Dessert,  MD 07/22/17 [phone]

## 2017-08-02 ENCOUNTER — Encounter (INDEPENDENT_AMBULATORY_CARE_PROVIDER_SITE_OTHER): Payer: Self-pay | Admitting: Physical Medicine and Rehabilitation

## 2017-08-02 ENCOUNTER — Encounter

## 2017-08-02 ENCOUNTER — Ambulatory Visit (INDEPENDENT_AMBULATORY_CARE_PROVIDER_SITE_OTHER): Payer: Self-pay

## 2017-08-02 ENCOUNTER — Ambulatory Visit (INDEPENDENT_AMBULATORY_CARE_PROVIDER_SITE_OTHER): Payer: Medicare Other | Admitting: Physical Medicine and Rehabilitation

## 2017-08-02 VITALS — BP 162/88 | HR 67 | Temp 97.7°F

## 2017-08-02 DIAGNOSIS — M48062 Spinal stenosis, lumbar region with neurogenic claudication: Secondary | ICD-10-CM | POA: Diagnosis not present

## 2017-08-02 DIAGNOSIS — G8929 Other chronic pain: Secondary | ICD-10-CM

## 2017-08-02 DIAGNOSIS — M419 Scoliosis, unspecified: Secondary | ICD-10-CM

## 2017-08-02 DIAGNOSIS — G894 Chronic pain syndrome: Secondary | ICD-10-CM

## 2017-08-02 DIAGNOSIS — M5442 Lumbago with sciatica, left side: Secondary | ICD-10-CM

## 2017-08-02 DIAGNOSIS — R269 Unspecified abnormalities of gait and mobility: Secondary | ICD-10-CM

## 2017-08-02 DIAGNOSIS — M5416 Radiculopathy, lumbar region: Secondary | ICD-10-CM

## 2017-08-02 DIAGNOSIS — M5441 Lumbago with sciatica, right side: Secondary | ICD-10-CM

## 2017-08-02 MED ORDER — GABAPENTIN 100 MG PO CAPS: ORAL_CAPSULE | ORAL | 0 refills | Status: DC

## 2017-08-02 MED ORDER — METHYLPREDNISOLONE ACETATE 80 MG/ML IJ SUSP
80.0000 mg | Freq: Once | INTRAMUSCULAR | Status: AC
  Administered 2017-08-02: 80 mg

## 2017-08-02 NOTE — Progress Notes (Signed)
 .  Numeric Pain Rating Scale and Functional Assessment Average Pain 10 Pain Right Now 9 My pain is constant and aching Pain is worse with: walking Pain improves with: rest   In the last MONTH (on 0-10 scale) has pain interfered with the following?  1. General activity like being  able to carry out your everyday physical activities such as walking, climbing stairs, carrying groceries, or moving a chair?  Rating(9)  2. Relation with others like being able to carry out your usual social activities and roles such as  activities at home, at work and in your community. Rating(6)  3. Enjoyment of life such that you have  been bothered by emotional problems such as feeling anxious, depressed or irritable?  Rating(6)

## 2017-08-02 NOTE — Procedures (Signed)
Lumbar Epidural Steroid Injection - Interlaminar Approach with Fluoroscopic Guidance  Patient: Deborah Bell      Date of Birth: 1928-01-26 MRN: [id number] PCP: Billie Ruddy, MD      Visit Date: 08/02/2017   Universal Protocol:     Consent Given By: the patient  Position: PRONE  Additional Comments: Vital signs were monitored before and after the procedure. Patient was prepped and draped in the usual sterile fashion. The correct patient, procedure, and site was verified.   Injection Procedure Details:  Procedure Site One Meds Administered:  Meds ordered this encounter  Medications  . methylPREDNISolone acetate (DEPO-MEDROL) injection 80 mg     Laterality: Right  Location/Site:  L4-L5  Needle size: 20 G  Needle type: Tuohy  Needle Placement: Paramedian epidural  Findings:   -Comments: Excellent flow of contrast into the epidural space.  Procedure Details: Using a paramedian approach from the side mentioned above, the region overlying the inferior lamina was localized under fluoroscopic visualization and the soft tissues overlying this structure were infiltrated with 4 ml. of 1% Lidocaine without Epinephrine. The Tuohy needle was inserted into the epidural space using a paramedian approach.   The epidural space was localized using loss of resistance along with lateral and bi-planar fluoroscopic views.  After negative aspirate for air, blood, and CSF, a 2 ml. volume of Isovue-250 was injected into the epidural space and the flow of contrast was observed. Radiographs were obtained for documentation purposes.    The injectate was administered into the level noted above.   Additional Comments:  The patient tolerated the procedure well Dressing: Band-Aid    Post-procedure details: Patient was observed during the procedure. Post-procedure instructions were reviewed.  Patient left the clinic in stable condition.

## 2017-08-02 NOTE — Patient Instructions (Signed)

## 2017-08-02 NOTE — Progress Notes (Signed)
Deborah Bell - 84 y.o. female MRN [id number]  Date of birth: Mar 31, 1928  Office Visit Note: Visit Date: 08/02/2017 PCP: Billie Ruddy, MD Referred by: Billie Ruddy, MD  Subjective: Chief Complaint  Patient presents with  . Lower Back - Pain  . Right Leg - Pain  . Right Foot - Numbness  . Left Foot - Numbness  . Fall   HPI: Deborah Bell is an 84 year old female who presents with her daughter takes care of her.  The daughter is primary caregiver.  She comes in today with worsening right hip and leg pain and a pretty classic L5 distribution.  She has a history of severe scoliosis particularly at the L3-4 region where there is some translation and curvature.  She has severe left-sided foraminal narrowing moderate to severe central canal stenosis at this level.  She has facet arthropathy below this level but without any other focal disc herniations or nerve compression.  She has no history of groin pain but does have referral in the hips.  She reports her pain is a 10 out of 10 at this point.  Prior injection several months ago was completed at the L3 region on the right.  There was excellent flow contrast.  The patient reports actually getting decreased symptoms of knee pain that she typically was getting knee injections for this is actually ceased at this point and she has no longer any knee pain.  She continues to still have a radicular type pain down the leg.  She does have axial back pain worse with standing.  Her other biggest complaint is decreased functionality do to some numbness in the feet bilaterally.  She ambulates with a cane when she is out which is very rarely.  She does use a rolling walker at her home.  She does not like to use the walker is out of the house and her daughter comments that this is somewhat of an issue with being vane.  She also wears shoes today with some heel.  She reports worsening balance and feels like she is going to fall at times when she gets up  and stands.  Her daughter finds herself increasingly helping her to walk.  The patient is essentially homebound to a great degree.  She does not have a house other than doctor's visits and getting her hair and nails done.  She does try to walk some in the house.  She has not noted any other focal weakness just feeling weak all over in terms of walking.  She has not noticed any specific tremor but she does feel unsteady and tremulous.  She has not had any falls backward but has had some falls over the last several months but nothing severe.  She otherwise is fairly healthy and really has essentially only hypertension.  She has had therapy in the past but none recently.  She has not seen a neurologist about any movement disorder or balance difficulty or tremor.  They do have paperwork from the [facility] for me to fill out today on the patient's functionality.  We will get this done and they can pick this up tomorrow.  Just as an aside the patient did have significant coughing and recent shortness of breath and this is evaluated at the emergency department because she has a history of pulmonary embolism many years ago with history of anticoagulation.  She has been off anticoagulation.  This had essentially resolved.  Her d-dimer value was high and  they did do a CTA angiogram.  This did not show any pulmonary embolism.  She continues to have a small cough and is using [employer]   Review of Systems  Constitutional: Positive for malaise/fatigue. Negative for chills, fever and weight loss.  HENT: Negative for hearing loss and sinus pain.   Eyes: Negative for blurred vision, double vision and photophobia.  Respiratory: Positive for cough. Negative for shortness of breath.   Cardiovascular: Negative for chest pain, palpitations and leg swelling.  Gastrointestinal: Negative for abdominal pain, nausea and vomiting.  Genitourinary: Negative for flank pain.  Musculoskeletal: Positive for back  pain, falls and joint pain. Negative for myalgias.       Right radicular leg pain  Skin: Negative for itching and rash.  Neurological: Positive for tingling. Negative for tremors, focal weakness and weakness.  Endo/Heme/Allergies: Negative.   Psychiatric/Behavioral: Negative for depression.  All other systems reviewed and are negative.  Otherwise per HPI.  Assessment & Plan: Visit Diagnoses:  1. Spinal stenosis of lumbar region with neurogenic claudication   2. Chronic midline low back pain with bilateral sciatica   3. Radiculopathy, lumbar region   4. Scoliosis of lumbar spine, unspecified scoliosis type   5. Chronic pain syndrome   6. Gait abnormality     Plan: Findings:  1.  In terms of her back and hip and leg pain particularly on the right I still think this is related to the stenosis as well as lateral recess narrowing and scoliosis.  Some of this is because of lack of movement as well encouraged her to stand and walk when she can notice difficulty now with some balance issues and will talk about below.  MRI is fairly recent from 2018 really not focal compression on the right.  She actually has more issues on the MRI on the left.  Prior injection recently gave her relief of some of her knee pain which could be an L3 or L4 distribution type pain.  I think the best plan for her right hip and leg is to complete a general epidural injection at L4-5 and hopefully get spread of medication along the lateral recesses into the L3-4 and L5-S1 region.  If that is beneficial obviously we can do that intermittently.  If that were to just clear up everything she was walking better than I think that would be great but it seems like she has some ongoing issues with perhaps peripheral neuropathy.  In terms of her leg pain and the possible peripheral neuropathy I am going to start a slow taper up of gabapentin at night.  I also asked the daughter to see about getting over-the-counter vitamin B12.  2.  She  is having increasing problems with gait abnormality and balance difficulties and what she feels is a tremulous type feeling when she stands and walks.  She does appear to have a positive Romberg's today but without cogwheeling and I see no specific tremor.  She ambulates with a slightly wide-based and unsteady gait but there is no shuffling.  I still think it would be wise for her to see a neurologist for evaluation.  She has not had this done.  I would defer to them for electrodiagnostic study versus lab study etc.  I am going to put a referral in for Dr. Wells Guiles Tat at [facility] neurology.    Meds & Orders:  Meds ordered this encounter  Medications  . methylPREDNISolone acetate (DEPO-MEDROL) injection 80 mg  . gabapentin (NEURONTIN) 100  MG capsule    Sig: Take 1 capsule by mouth at night for 7 nights and then 2 at night for 7 nights and then 3 at night for 7 nights. Then call physician.    Dispense:  90 capsule    Refill:  0    Orders Placed This Encounter  Procedures  . XR C-ARM NO REPORT  . Ambulatory referral to Neurology  . Epidural Steroid injection    Follow-up: Return if symptoms worsen or fail to improve.   Procedures: No procedures performed  Lumbar Epidural Steroid Injection - Interlaminar Approach with Fluoroscopic Guidance  Patient: ERCILIA BETTINGER      Date of Birth: January 28, 1928 MRN: [id number] PCP: Billie Ruddy, MD      Visit Date: 08/02/2017   Universal Protocol:     Consent Given By: the patient  Position: PRONE  Additional Comments: Vital signs were monitored before and after the procedure. Patient was prepped and draped in the usual sterile fashion. The correct patient, procedure, and site was verified.   Injection Procedure Details:  Procedure Site One Meds Administered:  Meds ordered this encounter  Medications  . methylPREDNISolone acetate (DEPO-MEDROL) injection 80 mg     Laterality: Right  Location/Site:  L4-L5  Needle size: 20  G  Needle type: Tuohy  Needle Placement: Paramedian epidural  Findings:   -Comments: Excellent flow of contrast into the epidural space.  Procedure Details: Using a paramedian approach from the side mentioned above, the region overlying the inferior lamina was localized under fluoroscopic visualization and the soft tissues overlying this structure were infiltrated with 4 ml. of 1% Lidocaine without Epinephrine. The Tuohy needle was inserted into the epidural space using a paramedian approach.   The epidural space was localized using loss of resistance along with lateral and bi-planar fluoroscopic views.  After negative aspirate for air, blood, and CSF, a 2 ml. volume of Isovue-250 was injected into the epidural space and the flow of contrast was observed. Radiographs were obtained for documentation purposes.    The injectate was administered into the level noted above.   Additional Comments:  The patient tolerated the procedure well Dressing: Band-Aid    Post-procedure details: Patient was observed during the procedure. Post-procedure instructions were reviewed.  Patient left the clinic in stable condition.   Clinical History: MRI LUMBAR SPINE WITHOUT CONTRAST  TECHNIQUE: Multiplanar, multisequence MR imaging of the lumbar spine was performed. No intravenous contrast was administered.  COMPARISON:  Plain films 02/04/2016.  FINDINGS: Segmentation:  Standard.  Alignment: Severe degenerative scoliosis convex RIGHT related to loss of interspace height on the LEFT at L3-4 and L2-3. Leftward translation L1 on L2. Trace anterolisthesis L3-L4. As correlated with plain films, scoliosis measures greater than 30 degrees.  Vertebrae: Other than endplate reactive changes most prominent at L3-4, no worrisome or significant osseous findings.  Conus medullaris and cauda equina: Conus extends to the L1 level. Conus and cauda equina appear normal.  Paraspinal and other soft  tissues: BILATERAL renal cystic disease incompletely evaluated.  Disc levels:  L1-L2: Central protrusion. Posterior element hypertrophy, slightly worse on the LEFT. LEFT greater than RIGHT L2 nerve root impingement. LEFT L1 nerve root impingement due to foraminal narrowing. Mild to moderate stenosis is superimposed.  L2-L3: Annular bulge. Posterior element hypertrophy. Borderline stenosis. Asymmetric loss of interspace height on the LEFT. LEFT L2 and L3 nerve root impingement.  L3-L4: Near complete loss of interspace height at L3-4 on the LEFT. Marked osseous spurring. Posterior element hypertrophy.  Central and leftward protrusion is superimposed. Trace anterolisthesis. Moderate to severe stenosis. Severe LEFT L3 and L4 nerve root impingement.  L4-L5: Good disc height and hydration. Posterior element hypertrophy. Annular bulge. Mild stenosis. Borderline subarticular zone narrowing on the RIGHT could affect the L5 nerve root.  L5-S1: Disc desiccation. Annular bulge. Facet arthropathy. No impingement.  IMPRESSION: The dominant abnormality is at L3-L4 where near complete loss of interspace height on the LEFT contributes to significant dextroconvex scoliosis (greater than 30 degrees), and bony overgrowth. Asymmetric posterior element hypertrophy along with leftward disc protrusion results in moderate to severe stenosis and LEFT L3/LEFT L4 nerve root impingement.  Similar less severe changes at L2-3 on the LEFT, with asymmetric loss of interspace is height in conjunction with annular bulge and posterior element hypertrophy result in L2 and L3 nerve root impingement at that level.  Leftward translation L1 on L2. Central protrusion with posterior element hypertrophy. Correlate clinically for symptomatic LEFT L1 and LEFT L2 nerve root impingement.  Marked BILATERAL renal cystic disease, incompletely evaluated. If further investigation desired, consider  sonography.   Electronically Signed   By: Staci Righter M.D.   On: 12/20/2016 07:56  02/04/2016 Lspine 4v Xray  AP and lateral lumbar spine x-ray shows moderate right-sided scoliosis  with a dextro rotatory component centered at about the L3-L4 region. There  is some rotation of the pelvis with some degenerative changes of the right  sacroiliac joint with fairly normal-appearing left sacroiliac joint. Hip  joints appear to show only mild degenerative changes with some decent  joint spaces. There are no fractures or dislocations.   She reports that she has never smoked. She has never used smokeless tobacco. No results for input(s): HGBA1C, LABURIC in the last 8760 hours.  Objective:  VS:  HT:    WT:   BMI:     BP:(!) 162/88  HR:67bpm  TEMP:97.7 F (36.5 C)(Oral)  RESP:96 % Physical Exam  Constitutional: She is oriented to person, place, and time. She appears well-developed and well-nourished. No distress.  HENT:  Head: Normocephalic and atraumatic.  Nose: Nose normal.  Mouth/Throat: Oropharynx is clear and moist.  Eyes: Pupils are equal, round, and reactive to light. Conjunctivae are normal.  Neck: Normal range of motion. Neck supple.  Cardiovascular: Normal rate, regular rhythm and intact distal pulses.  Pulmonary/Chest: Effort normal. No respiratory distress. She has no wheezes.  Abdominal: Soft. She exhibits no distension. There is no rebound and no guarding.  Musculoskeletal:  Patient ambulates with a wide-based unsteady gait using a cane.  She unfortunately does have heels on which are small but still present.  She does not have any shuffling gait.  She is able to turn without difficulty.  She arises from a seated location and sits with difficulty.  She has good strength in both limbs however on manual muscle testing 5 out of 5 equally with good dorsiflexion plantarflexion EHL.  She has no clonus.  She has no cogwheeling.  Neurological: She is alert and oriented to person,  place, and time. She exhibits normal muscle tone. Coordination abnormal.  Skin: Skin is warm. No rash noted. No erythema.  Psychiatric: She has a normal mood and affect. Her behavior is normal.  Nursing note and vitals reviewed.   Ortho Exam Imaging: Xr C-arm No Report  Result Date: 08/02/2017 Please see Notes tab for imaging impression.   Past Medical/Family/Surgical/Social History: Medications & Allergies reviewed per EMR, new medications updated. Patient Active Problem List   Diagnosis Date  Noted  . CKD (chronic kidney disease) stage 3, GFR 30-59 ml/min (HCC) 04/05/2017  . Glaucoma 04/05/2017  . Obesity, Class I, BMI 30-34.9 05/08/2016  . Incisional hernia of anterior abdominal wall without obstruction or gangrene 05/08/2016  . Hyperlipidemia 05/07/2016  . Chronic pain disorder 10/29/2015  . Essential hypertension 10/29/2015  . Unstable gait 10/29/2015  . Osteoarthritis of left knee 10/29/2015  . Vertigo 10/29/2015  . Low back pain with radiation, unspecified laterality 10/29/2015   Past Medical History:  Diagnosis Date  . Arthritis   . Diverticulitis   . History of fainting spells of unknown cause   . Hypercholesteremia   . Hypertension   . Pulmonary emboli (HCC)    Family History  Problem Relation Age of Onset  . Diabetes Mother   . Diabetes Father    Past Surgical History:  Procedure Laterality Date  . APPENDECTOMY    . CARPAL TUNNEL RELEASE    . HERNIA REPAIR  2011   mesh removed  . TONSILLECTOMY     Social History   Occupational History  . Not on file  Tobacco Use  . Smoking status: Never Smoker  . Smokeless tobacco: Never Used  Substance and Sexual Activity  . Alcohol use: No  . Drug use: No  . Sexual activity: Not Currently

## 2017-08-04 ENCOUNTER — Other Ambulatory Visit (INDEPENDENT_AMBULATORY_CARE_PROVIDER_SITE_OTHER): Payer: Self-pay | Admitting: Physical Medicine and Rehabilitation

## 2017-08-04 ENCOUNTER — Encounter: Payer: Self-pay | Admitting: Neurology

## 2017-08-04 ENCOUNTER — Telehealth (INDEPENDENT_AMBULATORY_CARE_PROVIDER_SITE_OTHER): Payer: Self-pay | Admitting: *Deleted

## 2017-08-04 DIAGNOSIS — M419 Scoliosis, unspecified: Secondary | ICD-10-CM

## 2017-08-04 DIAGNOSIS — M5416 Radiculopathy, lumbar region: Secondary | ICD-10-CM

## 2017-08-04 DIAGNOSIS — M5442 Lumbago with sciatica, left side: Secondary | ICD-10-CM

## 2017-08-04 DIAGNOSIS — R269 Unspecified abnormalities of gait and mobility: Secondary | ICD-10-CM

## 2017-08-04 DIAGNOSIS — G8929 Other chronic pain: Secondary | ICD-10-CM

## 2017-08-04 DIAGNOSIS — R202 Paresthesia of skin: Secondary | ICD-10-CM

## 2017-08-04 DIAGNOSIS — M48062 Spinal stenosis, lumbar region with neurogenic claudication: Secondary | ICD-10-CM

## 2017-08-04 DIAGNOSIS — M5441 Lumbago with sciatica, right side: Secondary | ICD-10-CM

## 2017-08-04 DIAGNOSIS — G894 Chronic pain syndrome: Secondary | ICD-10-CM

## 2017-08-04 NOTE — Telephone Encounter (Signed)
Called pt and spoke to daughter Jannet Calip and informed her that form is ready for pick up.

## 2017-09-23 DIAGNOSIS — H9113 Presbycusis, bilateral: Secondary | ICD-10-CM | POA: Diagnosis not present

## 2017-09-23 DIAGNOSIS — I10 Essential (primary) hypertension: Secondary | ICD-10-CM | POA: Diagnosis not present

## 2017-09-23 DIAGNOSIS — Z23 Encounter for immunization: Secondary | ICD-10-CM | POA: Diagnosis not present

## 2017-09-23 DIAGNOSIS — E785 Hyperlipidemia, unspecified: Secondary | ICD-10-CM | POA: Diagnosis not present

## 2017-10-04 ENCOUNTER — Ambulatory Visit (INDEPENDENT_AMBULATORY_CARE_PROVIDER_SITE_OTHER): Payer: Medicare Other | Admitting: Neurology

## 2017-10-04 ENCOUNTER — Other Ambulatory Visit (INDEPENDENT_AMBULATORY_CARE_PROVIDER_SITE_OTHER): Payer: Medicare Other

## 2017-10-04 ENCOUNTER — Encounter: Payer: Self-pay | Admitting: Neurology

## 2017-10-04 VITALS — BP 108/70 | HR 77 | Ht <= 58 in | Wt 140.0 lb

## 2017-10-04 DIAGNOSIS — N183 Chronic kidney disease, stage 3 unspecified: Secondary | ICD-10-CM

## 2017-10-04 DIAGNOSIS — G629 Polyneuropathy, unspecified: Secondary | ICD-10-CM

## 2017-10-04 DIAGNOSIS — R6889 Other general symptoms and signs: Secondary | ICD-10-CM

## 2017-10-04 DIAGNOSIS — M5416 Radiculopathy, lumbar region: Secondary | ICD-10-CM | POA: Diagnosis not present

## 2017-10-04 DIAGNOSIS — E785 Hyperlipidemia, unspecified: Secondary | ICD-10-CM

## 2017-10-04 DIAGNOSIS — I10 Essential (primary) hypertension: Secondary | ICD-10-CM | POA: Diagnosis not present

## 2017-10-04 LAB — VITAMIN D 25 HYDROXY (VIT D DEFICIENCY, FRACTURES): VITD: 45.9 ng/mL (ref 30.00–100.00)

## 2017-10-04 LAB — COMPREHENSIVE METABOLIC PANEL
ALK PHOS: 79 U/L (ref 39–117)
ALT: 13 U/L (ref 0–35)
AST: 23 U/L (ref 0–37)
Albumin: 4.2 g/dL (ref 3.5–5.2)
BUN: 19 mg/dL (ref 6–23)
CO2: 36 mEq/L — ABNORMAL HIGH (ref 19–32)
Calcium: 10.2 mg/dL (ref 8.4–10.5)
Chloride: 103 mEq/L (ref 96–112)
Creatinine, Ser: 1.04 mg/dL (ref 0.40–1.20)
GFR: 53.26 mL/min — ABNORMAL LOW (ref >60.00)
GLUCOSE: 108 mg/dL — AB (ref 70–99)
POTASSIUM: 3.8 meq/L (ref 3.5–5.1)
Sodium: 143 mEq/L (ref 135–145)
TOTAL PROTEIN: 7.7 g/dL (ref 6.0–8.3)
Total Bilirubin: 0.4 mg/dL (ref 0.2–1.2)

## 2017-10-04 LAB — LIPID PANEL
Cholesterol: 153 mg/dL (ref 0–200)
HDL: 37.3 mg/dL — AB (ref >39.00)
NonHDL: 115.62
Total CHOL/HDL Ratio: 4
Triglycerides: 214 mg/dL — ABNORMAL HIGH (ref 0.0–149.0)
VLDL: 42.8 mg/dL — ABNORMAL HIGH (ref 0.0–40.0)

## 2017-10-04 LAB — MICROALBUMIN / CREATININE URINE RATIO
CREATININE, U: 170.3 mg/dL
MICROALB/CREAT RATIO: 3.9 mg/g (ref 0.0–30.0)
Microalb, Ur: 6.7 mg/dL — ABNORMAL HIGH (ref 0.0–1.9)

## 2017-10-04 LAB — LDL CHOLESTEROL, DIRECT: Direct LDL: 94 mg/dL

## 2017-10-04 NOTE — Progress Notes (Signed)
NEUROLOGY CONSULTATION NOTE  CARIN SHIPP MRN: [id number] DOB: [birth year]  Referring provider: Magnus Sinning, MD  Primary care provider: Grier Mitts, MD  Reason for consult:  Abnormal gait.  HISTORY OF PRESENT ILLNESS: Deborah Bell is an 84 year old female with arthritis, hypertension and history of pulmonary emboli who presents for abnormal gait.  She is accompanied by her daughter who supplements history.  She has history of  lumbar radiculopathy and osteoarthritis involving the knees as well.  In the past, sacroiliac joint injections were ineffective but knee injections and lumbar intralaminar epidural injections had been effective.    MRI of lumbar spine from 12/20/16 was personally reviewed and has demonstrated severe scoliosis more pronounced at L3-4 and severe left-sided foraminal narrowing with moderate to severe central canal stenosis.  Over the past several months, she has had worsening low back pain and right hip and leg pain in L5 distribution.  She also reports numbness and tingling in the feet bilaterally for many years.  The numbness and tingling of the entire foot bilaterally, right worse than left, radiating up to below the knees.  Symptoms have not progressed over the years.  She says her legs feel weak but actually are strong.  There is no associated neuropathic pain.  Sometimes she reports numbness in the hands, but has history of carpal tunnel syndrome.  She was given gabapentin, which was ineffective for the numbness.  PAST MEDICAL HISTORY: Past Medical History:  Diagnosis Date  . Arthritis   . Diverticulitis   . History of fainting spells of unknown cause   . Hypercholesteremia   . Hypertension   . Pulmonary emboli ([place])     PAST SURGICAL HISTORY: Past Surgical History:  Procedure Laterality Date  . APPENDECTOMY    . CARPAL TUNNEL RELEASE    . HERNIA REPAIR  2011   mesh removed  . TONSILLECTOMY      MEDICATIONS: Current Outpatient  Medications on File Prior to Visit  Medication Sig Dispense Refill  . amLODipine (NORVASC) 2.5 MG tablet Take 1 tablet (2.5 mg total) by mouth daily. 90 tablet 3  . benzonatate (TESSALON) 100 MG capsule Take 1 capsule (100 mg total) by mouth 2 (two) times daily. 20 capsule 0  . CALCIUM PO Take 1 tablet by mouth daily.    [name] gabapentin (NEURONTIN) 100 MG capsule Take 1 capsule by mouth at night for 7 nights and then 2 at night for 7 nights and then 3 at night for 7 nights. Then call physician. 90 capsule 0  . NON FORMULARY [facility]  Onychomycosis Nail Lacquer -  Fluconazole 2%, Terbinafine 1% DMSO Apply to affected nail once daily Qty. 120 gm 3 refills    . simvastatin (ZOCOR) 20 MG tablet Take 1 tablet (20 mg total) by mouth daily. 90 tablet 3   No current facility-administered medications on file prior to visit.     ALLERGIES: No Known Allergies  FAMILY HISTORY: Family History  Problem Relation Age of Onset  . Diabetes Mother   . Diabetes Father    SOCIAL HISTORY: Social History   Socioeconomic History  . Marital status: Legally Separated    Spouse name: Not on file  . Number of children: Not on file  . Years of education: Not on file  . Highest education level: Not on file  Occupational History  . Not on file  Social Needs  . Financial resource strain: Not on file  . Food insecurity:  Worry: Not on file    Inability: Not on file  . Transportation needs:    Medical: Not on file    Non-medical: Not on file  Tobacco Use  . Smoking status: Never Smoker  . Smokeless tobacco: Never Used  Substance and Sexual Activity  . Alcohol use: No  . Drug use: No  . Sexual activity: Not Currently  Lifestyle  . Physical activity:    Days per week: Not on file    Minutes per session: Not on file  . Stress: Not on file  Relationships  . Social connections:    Talks on phone: Not on file    Gets together: Not on file    Attends religious service: Not on file     Active member of club or organization: Not on file    Attends meetings of clubs or organizations: Not on file    Relationship status: Not on file  . Intimate partner violence:    Fear of current or ex partner: Not on file    Emotionally abused: Not on file    Physically abused: Not on file    Forced sexual activity: Not on file  Other Topics Concern  . Not on file  Social History Narrative  . Not on file    REVIEW OF SYSTEMS: Constitutional: No fevers, chills, or sweats, no generalized fatigue, change in appetite Eyes: No visual changes, double vision, eye pain Ear, nose and throat: No hearing loss, ear pain, nasal congestion, sore throat Cardiovascular: No chest pain, palpitations Respiratory:  No shortness of breath at rest or with exertion, wheezes GastrointestinaI: No nausea, vomiting, diarrhea, abdominal pain, fecal incontinence Genitourinary:  No dysuria, urinary retention or frequency Musculoskeletal:  ack pain Integumentary: No rash, pruritus, skin lesions Neurological: as above Psychiatric: No depression, insomnia, anxiety Endocrine: No palpitations, fatigue, diaphoresis, mood swings, change in appetite, change in weight, increased thirst Hematologic/Lymphatic:  No purpura, petechiae. Allergic/Immunologic: no itchy/runny eyes, nasal congestion, recent allergic reactions, rashes  PHYSICAL EXAM: Blood pressure 108/70, pulse 77, height 4\' 10"  (1.473 m), weight 140 lb (63.5 kg), SpO2 95 %. General: No acute distress.  Patient appears well-groomed.   Head:  Normocephalic/atraumatic Eyes:  fundi examined but not visualized Neck: supple, no paraspinal tenderness, full range of motion Back: No paraspinal tenderness Heart: regular rate and rhythm Lungs: Clear to auscultation bilaterally. Vascular: No carotid bruits. Neurological Exam: Mental status: alert and oriented to person, place, and time, recent and remote memory intact, fund of knowledge intact, attention and  concentration intact, speech fluent and not dysarthric, language intact. Cranial nerves: CN I: not tested CN II: pupils equal, round and reactive to light, visual fields intact CN III, IV, VI:  full range of motion, no nystagmus, no ptosis CN V: facial sensation intact CN VII: upper and lower face symmetric CN VIII: hearing intact CN IX, X: gag intact, uvula midline CN XI: sternocleidomastoid and trapezius muscles intact CN XII: tongue midline Bulk & Tone: normal, no fasciculations. Motor:  5/5 throughout  Sensation:  Inconsistent decreased pinprick sensation in the lower extremities up to below knees and right hand (in no specific dermatomal pattern), vibration sensation intact.  Deep Tendon Reflexes:  2+ throughout, toes downgoing.   Finger to nose testing:  Without dysmetria.   Heel to shin:  Without dysmetria.   Gait:  Unsteady gait.  Uses cane. Romberg with sway.  IMPRESSION: Bilateral lower extremity numbness and tingling.  Could be related to peripheral neuropathy vs bilateral lumbosacral radiculopathy.  Distribution of numbness is somewhat inconsistent on exam.  PLAN: 1. We will check B12 and TSH as they are simple labs to test and treat. 2.  Otherwise, no further workup such as NCV-EMG or other neuropathy labs, will be checked as not likely to change management. 3.  As the neuropathy is not painful, gabapentin or other neuropathic medication, will not likely be effective.  Thank you for allowing me to take part in the care of this patient.  Metta Clines, DO  CC:  Magnus Sinning, MD  Bernerd Limbo, MD

## 2017-10-04 NOTE — Patient Instructions (Addendum)
1.  We will check B12 and TSH.  Further recommendations pending results.  Your provider has requested that you have labwork completed today. Please go to [facility] ([id number]) on the second floor of this building before leaving the office today. You do not need to check in. If you are not called within 15 minutes please check with the front desk.

## 2017-10-05 LAB — VITAMIN B12: Vitamin B-12: 779 pg/mL (ref 200–1100)

## 2017-10-05 LAB — TSH: TSH: 1.77 m[IU]/L (ref 0.40–4.50)

## 2017-10-11 ENCOUNTER — Telehealth: Payer: Self-pay | Admitting: Neurology

## 2017-10-11 NOTE — Telephone Encounter (Signed)
Patient/daughter made aware of results.

## 2017-10-11 NOTE — Telephone Encounter (Signed)
-----   Message from Pieter Partridge, DO sent at 10/05/2017  8:34 AM EDT ----- B12 and thyroid are normal

## 2017-10-15 ENCOUNTER — Encounter (INDEPENDENT_AMBULATORY_CARE_PROVIDER_SITE_OTHER): Payer: Self-pay | Admitting: Orthopaedic Surgery

## 2017-10-15 ENCOUNTER — Ambulatory Visit (INDEPENDENT_AMBULATORY_CARE_PROVIDER_SITE_OTHER): Payer: Self-pay

## 2017-10-15 ENCOUNTER — Ambulatory Visit (INDEPENDENT_AMBULATORY_CARE_PROVIDER_SITE_OTHER): Payer: Medicare Other | Admitting: Orthopaedic Surgery

## 2017-10-15 VITALS — BP 160/80 | HR 81 | Resp 18 | Ht <= 58 in | Wt 136.0 lb

## 2017-10-15 DIAGNOSIS — G8929 Other chronic pain: Secondary | ICD-10-CM

## 2017-10-15 DIAGNOSIS — M1712 Unilateral primary osteoarthritis, left knee: Secondary | ICD-10-CM

## 2017-10-15 DIAGNOSIS — M25562 Pain in left knee: Principal | ICD-10-CM

## 2017-10-15 MED ORDER — LIDOCAINE HCL 1 % IJ SOLN
2.0000 mL | INTRAMUSCULAR | Status: AC | PRN
  Administered 2017-10-15: 2 mL

## 2017-10-15 MED ORDER — METHYLPREDNISOLONE ACETATE 40 MG/ML IJ SUSP
80.0000 mg | INTRAMUSCULAR | Status: AC | PRN
  Administered 2017-10-15: 80 mg

## 2017-10-15 MED ORDER — BUPIVACAINE HCL 0.5 % IJ SOLN
2.0000 mL | INTRAMUSCULAR | Status: AC | PRN
  Administered 2017-10-15: 2 mL via INTRA_ARTICULAR

## 2017-10-15 NOTE — Progress Notes (Signed)
Office Visit Note   Patient: Deborah Bell           Date of Birth: 08/24/1928           MRN: [id number] Visit Date: 10/15/2017              Requested by: Bernerd Limbo, MD [address] [place], [place] [id number] PCP: Bernerd Limbo, MD   Assessment & Plan: Visit Diagnoses:  1. Chronic pain of left knee   2. Primary osteoarthritis of left knee     Plan: Osteoarthritis left knee.  Will repeat cortisone injection as it made a big difference in February.  Doing well with her back after Dr. Ernestina Patches injected  Follow-Up Instructions: Return if symptoms worsen or fail to improve.   Orders:  Orders Placed This Encounter  Procedures  . Large Joint Inj: L knee   No orders of the defined types were placed in this encounter.     Procedures: Large Joint Inj: L knee on 10/15/2017 4:11 PM Indications: pain and diagnostic evaluation Details: 25 G 1.5 in needle, anteromedial approach  Arthrogram: No  Medications: 2 mL lidocaine 1 %; 2 mL bupivacaine 0.5 %; 80 mg methylPREDNISolone acetate 40 MG/ML Procedure, treatment alternatives, risks and benefits explained, specific risks discussed. Consent was given by the patient. Patient was prepped and draped in the usual sterile fashion.       Clinical Data: No additional findings.   Subjective: Chief Complaint  Patient presents with  . Left Leg - Pain  . Leg Pain    Left leg pain and swelling x over 1 week, difficulty walking, difficulty sleeping at night, no injury, no surgery to left knee, not diabetic, "feels likes its out of join", Tylenol helps  Previously diagnosed with osteoarthritis left knee.  Did well with cortisone injection in February.  In the interim is had Dr. Ernestina Patches and inject her back with good result.  Having some recurrent pain in her left knee and wishes to have another shot of cortisone HPI  Review of Systems  Constitutional: Positive for fatigue.  HENT: Negative for trouble swallowing.   Eyes:  Negative for pain.  Respiratory: Negative for shortness of breath.   Cardiovascular: Positive for leg swelling.  Gastrointestinal: Positive for constipation.  Endocrine: Negative for cold intolerance.  Genitourinary: Negative for difficulty urinating.  Musculoskeletal: Positive for back pain, gait problem and joint swelling.  Skin: Negative for rash.  Allergic/Immunologic: Negative for food allergies.  Neurological: Positive for weakness and numbness.  Hematological: Does not bruise/bleed easily.  Psychiatric/Behavioral: Positive for sleep disturbance.     Objective: Vital Signs: BP (!) 160/80 (BP Location: Left Arm, Patient Position: Sitting, Cuff Size: Normal)   Pulse 81   Resp 18   Ht 4\' 10"  (1.473 m)   Wt 136 lb (61.7 kg)   BMI 28.42 kg/m   Physical Exam  Constitutional: She is oriented to person, place, and time. She appears well-developed and well-nourished.  HENT:  Mouth/Throat: Oropharynx is clear and moist.  Eyes: Pupils are equal, round, and reactive to light. EOM are normal.  Pulmonary/Chest: Effort normal.  Neurological: She is alert and oriented to person, place, and time.  Skin: Skin is warm and dry.  Psychiatric: She has a normal mood and affect. Her behavior is normal.    Ortho Exam awake alert and oriented x3.  Comfortable sitting.  Left knee was not hot warm or red.  No effusion.  Predominantly medial joint pain.  Does use a three-point cane to help with her balance.  Full extension and flexed about 100 degrees without instability  Specialty Comments:  No specialty comments available.  Imaging: No results found.   PMFS History: Patient Active Problem List   Diagnosis Date Noted  . CKD (chronic kidney disease) stage 3, GFR 30-59 ml/min (HCC) 04/05/2017  . Glaucoma 04/05/2017  . Obesity, Class I, BMI 30-34.9 05/08/2016  . Incisional hernia of anterior abdominal wall without obstruction or gangrene 05/08/2016  . Hyperlipidemia 05/07/2016  . Chronic  pain disorder 10/29/2015  . Essential hypertension 10/29/2015  . Unstable gait 10/29/2015  . Osteoarthritis of left knee 10/29/2015  . Vertigo 10/29/2015  . Low back pain with radiation, unspecified laterality 10/29/2015   Past Medical History:  Diagnosis Date  . Arthritis   . Diverticulitis   . History of fainting spells of unknown cause   . Hypercholesteremia   . Hypertension   . Pulmonary emboli (HCC)     Family History  Problem Relation Age of Onset  . Diabetes Mother   . Diabetes Father     Past Surgical History:  Procedure Laterality Date  . APPENDECTOMY    . CARPAL TUNNEL RELEASE    . HERNIA REPAIR  2011   mesh removed  . TONSILLECTOMY     Social History   Occupational History  . Occupation: widowed  Tobacco Use  . Smoking status: Never Smoker  . Smokeless tobacco: Never Used  Substance and Sexual Activity  . Alcohol use: No  . Drug use: No  . Sexual activity: Not Currently

## 2017-11-21 ENCOUNTER — Other Ambulatory Visit: Payer: Self-pay | Admitting: Family Medicine

## 2017-11-21 DIAGNOSIS — E785 Hyperlipidemia, unspecified: Secondary | ICD-10-CM

## 2018-01-20 ENCOUNTER — Telehealth (INDEPENDENT_AMBULATORY_CARE_PROVIDER_SITE_OTHER): Payer: Self-pay | Admitting: Physical Medicine and Rehabilitation

## 2018-01-20 NOTE — Telephone Encounter (Signed)
Is auth needed for [id number]? Scheduled for 1/20 at 1500 pending.

## 2018-01-20 NOTE — Telephone Encounter (Signed)
Left message for patient's daughter to call back to schedule. She is requesting an appointment after 1/17 and later in the afternoon.

## 2018-01-20 NOTE — Telephone Encounter (Signed)
ok 

## 2018-01-20 NOTE — Telephone Encounter (Signed)
Notification or Prior Authorization is not required for the requested services  This [employer] members plan does not currently require a prior authorization for [address]  Decision ID #:[id number]

## 2018-02-07 ENCOUNTER — Ambulatory Visit (INDEPENDENT_AMBULATORY_CARE_PROVIDER_SITE_OTHER): Payer: Self-pay

## 2018-02-07 ENCOUNTER — Ambulatory Visit (INDEPENDENT_AMBULATORY_CARE_PROVIDER_SITE_OTHER): Payer: Medicare Other | Admitting: Physical Medicine and Rehabilitation

## 2018-02-07 ENCOUNTER — Encounter (INDEPENDENT_AMBULATORY_CARE_PROVIDER_SITE_OTHER): Payer: Self-pay | Admitting: Physical Medicine and Rehabilitation

## 2018-02-07 VITALS — BP 145/74 | HR 75 | Temp 98.2°F | Ht <= 58 in | Wt 137.0 lb

## 2018-02-07 DIAGNOSIS — M48062 Spinal stenosis, lumbar region with neurogenic claudication: Secondary | ICD-10-CM | POA: Diagnosis not present

## 2018-02-07 DIAGNOSIS — M5416 Radiculopathy, lumbar region: Secondary | ICD-10-CM | POA: Diagnosis not present

## 2018-02-07 MED ORDER — METHYLPREDNISOLONE ACETATE 80 MG/ML IJ SUSP
80.0000 mg | Freq: Once | INTRAMUSCULAR | Status: AC
  Administered 2018-02-07: 80 mg

## 2018-02-07 NOTE — Progress Notes (Signed)
Deborah Bell - 85 y.o. female MRN [id number]  Date of birth: 1928/11/21  Office Visit Note: Visit Date: 02/07/2018 PCP: Bernerd Limbo, MD Referred by: Bernerd Limbo, MD  Subjective: Chief Complaint  Patient presents with  . Spine - Pain   HPI: Deborah Bell is a 85 y.o. female who comes in today For planned repeat L4-5 interlaminar epidural steroid injection.  Patient has known severe stenosis at L3-4 with degenerative scoliosis and facet arthritis.  Prior injection in July gave her approximately 3 months of decent relief particularly of her leg and knee pain.  She was in [place] for many months and really did not want to seek out any injections there.  She comes in today with worsening left more than right radicular pain consistent with lumbar stenosis.  We will repeat the injection today and see how she does.  If this is helping her for 3 to 4 months at a time it is okay to repeat these.  This will still be diagnostic and therapeutic as it is been since July since we have seen her.  She is had no new injuries or falls.  No focal weakness.  No weakness on exam.  ROS Otherwise per HPI.  Assessment & Plan: Visit Diagnoses:  1. Lumbar radiculopathy   2. Spinal stenosis of lumbar region with neurogenic claudication     Plan: No additional findings.   Meds & Orders:  Meds ordered this encounter  Medications  . methylPREDNISolone acetate (DEPO-MEDROL) injection 80 mg    Orders Placed This Encounter  Procedures  . XR C-ARM NO REPORT  . Epidural Steroid injection    Follow-up: Return if symptoms worsen or fail to improve.   Procedures: No procedures performed  Lumbar Epidural Steroid Injection - Interlaminar Approach with Fluoroscopic Guidance  Patient: Deborah Bell      Date of Birth: [birth year] MRN: [id number] PCP: Bernerd Limbo, MD      Visit Date: 02/07/2018   Universal Protocol:     Consent Given By: the patient  Position: PRONE  Additional  Comments: Vital signs were monitored before and after the procedure. Patient was prepped and draped in the usual sterile fashion. The correct patient, procedure, and site was verified.   Injection Procedure Details:  Procedure Site One Meds Administered:  Meds ordered this encounter  Medications  . methylPREDNISolone acetate (DEPO-MEDROL) injection 80 mg     Laterality: Left  Location/Site:  L4-L5  Needle size: 20 G  Needle type: Tuohy  Needle Placement: Paramedian epidural  Findings:   -Comments: Excellent flow of contrast into the epidural space.  Procedure Details: Using a paramedian approach from the side mentioned above, the region overlying the inferior lamina was localized under fluoroscopic visualization and the soft tissues overlying this structure were infiltrated with 4 ml. of 1% Lidocaine without Epinephrine. The Tuohy needle was inserted into the epidural space using a paramedian approach.   The epidural space was localized using loss of resistance along with lateral and bi-planar fluoroscopic views.  After negative aspirate for air, blood, and CSF, a 2 ml. volume of Isovue-250 was injected into the epidural space and the flow of contrast was observed. Radiographs were obtained for documentation purposes.    The injectate was administered into the level noted above.   Additional Comments:  The patient tolerated the procedure well Dressing: Band-Aid    Post-procedure details: Patient was observed during the procedure. Post-procedure instructions were reviewed.  Patient left the clinic in stable condition.  Clinical History: MRI LUMBAR SPINE WITHOUT CONTRAST  TECHNIQUE: Multiplanar, multisequence MR imaging of the lumbar spine was performed. No intravenous contrast was administered.  COMPARISON:  Plain films 02/04/2016.  FINDINGS: Segmentation:  Standard.  Alignment: Severe degenerative scoliosis convex RIGHT related to loss of interspace  height on the LEFT at L3-4 and L2-3. Leftward translation L1 on L2. Trace anterolisthesis L3-L4. As correlated with plain films, scoliosis measures greater than 30 degrees.  Vertebrae: Other than endplate reactive changes most prominent at L3-4, no worrisome or significant osseous findings.  Conus medullaris and cauda equina: Conus extends to the L1 level. Conus and cauda equina appear normal.  Paraspinal and other soft tissues: BILATERAL renal cystic disease incompletely evaluated.  Disc levels:  L1-L2: Central protrusion. Posterior element hypertrophy, slightly worse on the LEFT. LEFT greater than RIGHT L2 nerve root impingement. LEFT L1 nerve root impingement due to foraminal narrowing. Mild to moderate stenosis is superimposed.  L2-L3: Annular bulge. Posterior element hypertrophy. Borderline stenosis. Asymmetric loss of interspace height on the LEFT. LEFT L2 and L3 nerve root impingement.  L3-L4: Near complete loss of interspace height at L3-4 on the LEFT. Marked osseous spurring. Posterior element hypertrophy. Central and leftward protrusion is superimposed. Trace anterolisthesis. Moderate to severe stenosis. Severe LEFT L3 and L4 nerve root impingement.  L4-L5: Good disc height and hydration. Posterior element hypertrophy. Annular bulge. Mild stenosis. Borderline subarticular zone narrowing on the RIGHT could affect the L5 nerve root.  L5-S1: Disc desiccation. Annular bulge. Facet arthropathy. No impingement.  IMPRESSION: The dominant abnormality is at L3-L4 where near complete loss of interspace height on the LEFT contributes to significant dextroconvex scoliosis (greater than 30 degrees), and bony overgrowth. Asymmetric posterior element hypertrophy along with leftward disc protrusion results in moderate to severe stenosis and LEFT L3/LEFT L4 nerve root impingement.  Similar less severe changes at L2-3 on the LEFT, with asymmetric loss of interspace is  height in conjunction with annular bulge and posterior element hypertrophy result in L2 and L3 nerve root impingement at that level.  Leftward translation L1 on L2. Central protrusion with posterior element hypertrophy. Correlate clinically for symptomatic LEFT L1 and LEFT L2 nerve root impingement.  Marked BILATERAL renal cystic disease, incompletely evaluated. If further investigation desired, consider sonography.   Electronically Signed   By: Staci Righter M.D.   On: 12/20/2016 07:56  02/04/2016 Lspine 4v Xray  AP and lateral lumbar spine x-ray shows moderate right-sided scoliosis  with a dextro rotatory component centered at about the L3-L4 region. There  is some rotation of the pelvis with some degenerative changes of the right  sacroiliac joint with fairly normal-appearing left sacroiliac joint. Hip  joints appear to show only mild degenerative changes with some decent  joint spaces. There are no fractures or dislocations.   She reports that she has never smoked. She has never used smokeless tobacco. No results for input(s): HGBA1C, LABURIC in the last 8760 hours.  Objective:  VS:  HT:4\' 10"  (147.3 cm)   WT:137 lb (62.1 kg)  BMI:28.64    BP:(!) 145/74  HR:75bpm  TEMP:98.2 F (36.8 C)(Oral)  RESP:  Physical Exam  Ortho Exam Imaging: No results found.  Past Medical/Family/Surgical/Social History: Medications & Allergies reviewed per EMR, new medications updated. Patient Active Problem List   Diagnosis Date Noted  . CKD (chronic kidney disease) stage 3, GFR 30-59 ml/min (HCC) 04/05/2017  . Glaucoma 04/05/2017  . Obesity, Class I, BMI 30-34.9 05/08/2016  . Incisional hernia of anterior abdominal wall  without obstruction or gangrene 05/08/2016  . Hyperlipidemia 05/07/2016  . Chronic pain disorder 10/29/2015  . Essential hypertension 10/29/2015  . Unstable gait 10/29/2015  . Osteoarthritis of left knee 10/29/2015  . Vertigo 10/29/2015  . Low back pain with  radiation, unspecified laterality 10/29/2015   Past Medical History:  Diagnosis Date  . Arthritis   . Diverticulitis   . History of fainting spells of unknown cause   . Hypercholesteremia   . Hypertension   . Pulmonary emboli (HCC)    Family History  Problem Relation Age of Onset  . Diabetes Mother   . Diabetes Father    Past Surgical History:  Procedure Laterality Date  . APPENDECTOMY    . CARPAL TUNNEL RELEASE    . HERNIA REPAIR  2011   mesh removed  . TONSILLECTOMY     Social History   Occupational History  . Occupation: widowed  Tobacco Use  . Smoking status: Never Smoker  . Smokeless tobacco: Never Used  Substance and Sexual Activity  . Alcohol use: No  . Drug use: No  . Sexual activity: Not Currently

## 2018-02-07 NOTE — Procedures (Signed)
Lumbar Epidural Steroid Injection - Interlaminar Approach with Fluoroscopic Guidance  Patient: Deborah Bell      Date of Birth: 08-Feb-1928 MRN: [id number] PCP: Bernerd Limbo, MD      Visit Date: 02/07/2018   Universal Protocol:     Consent Given By: the patient  Position: PRONE  Additional Comments: Vital signs were monitored before and after the procedure. Patient was prepped and draped in the usual sterile fashion. The correct patient, procedure, and site was verified.   Injection Procedure Details:  Procedure Site One Meds Administered:  Meds ordered this encounter  Medications  . methylPREDNISolone acetate (DEPO-MEDROL) injection 80 mg     Laterality: Left  Location/Site:  L4-L5  Needle size: 20 G  Needle type: Tuohy  Needle Placement: Paramedian epidural  Findings:   -Comments: Excellent flow of contrast into the epidural space.  Procedure Details: Using a paramedian approach from the side mentioned above, the region overlying the inferior lamina was localized under fluoroscopic visualization and the soft tissues overlying this structure were infiltrated with 4 ml. of 1% Lidocaine without Epinephrine. The Tuohy needle was inserted into the epidural space using a paramedian approach.   The epidural space was localized using loss of resistance along with lateral and bi-planar fluoroscopic views.  After negative aspirate for air, blood, and CSF, a 2 ml. volume of Isovue-250 was injected into the epidural space and the flow of contrast was observed. Radiographs were obtained for documentation purposes.    The injectate was administered into the level noted above.   Additional Comments:  The patient tolerated the procedure well Dressing: Band-Aid    Post-procedure details: Patient was observed during the procedure. Post-procedure instructions were reviewed.  Patient left the clinic in stable condition.

## 2018-02-07 NOTE — Progress Notes (Signed)
Numeric Pain Rating Scale and Functional Assessment Average Pain 10   In the last MONTH (on 0-10 scale) has pain interfered with the following?  1. General activity like being  able to carry out your everyday physical activities such as walking, climbing stairs, carrying groceries, or moving a chair?  Rating(10)   +Driver, -BT, -Dye Allergies. 

## 2018-02-07 NOTE — Patient Instructions (Signed)

## 2018-03-01 ENCOUNTER — Ambulatory Visit (INDEPENDENT_AMBULATORY_CARE_PROVIDER_SITE_OTHER): Payer: Medicare Other | Admitting: Orthopaedic Surgery

## 2018-03-01 ENCOUNTER — Encounter (INDEPENDENT_AMBULATORY_CARE_PROVIDER_SITE_OTHER): Payer: Self-pay | Admitting: Orthopaedic Surgery

## 2018-03-01 VITALS — BP 128/82 | HR 84 | Ht <= 58 in | Wt 134.0 lb

## 2018-03-01 DIAGNOSIS — M1712 Unilateral primary osteoarthritis, left knee: Secondary | ICD-10-CM | POA: Diagnosis not present

## 2018-03-01 MED ORDER — METHYLPREDNISOLONE ACETATE 40 MG/ML IJ SUSP
80.0000 mg | INTRAMUSCULAR | Status: AC | PRN
  Administered 2018-03-01: 80 mg

## 2018-03-01 MED ORDER — LIDOCAINE HCL 1 % IJ SOLN
2.0000 mL | INTRAMUSCULAR | Status: AC | PRN
  Administered 2018-03-01: 2 mL

## 2018-03-01 MED ORDER — BUPIVACAINE HCL 0.5 % IJ SOLN
2.0000 mL | INTRAMUSCULAR | Status: AC | PRN
Start: 2018-03-01 — End: 2018-03-01
  Administered 2018-03-01: 2 mL via INTRA_ARTICULAR

## 2018-03-01 NOTE — Progress Notes (Signed)
Office Visit Note   Patient: Deborah Bell           Date of Birth: Jul 09, 1928           MRN: [id number] Visit Date: 03/01/2018              Requested by: Bernerd Limbo, MD [address] [place], [place] [id number] PCP: Bernerd Limbo, MD   Assessment & Plan: Visit Diagnoses:  1. Primary osteoarthritis of left knee     Plan: Has had successful prior cortisone injections left knee.  Will reinject today  Follow-Up Instructions: Return if symptoms worsen or fail to improve.   Orders:  Orders Placed This Encounter  Procedures  . Large Joint Inj: L knee   No orders of the defined types were placed in this encounter.     Procedures: Large Joint Inj: L knee on 03/01/2018 5:29 PM Indications: pain and diagnostic evaluation Details: 25 G 1.5 in needle, anteromedial approach  Arthrogram: No  Medications: 2 mL lidocaine 1 %; 2 mL bupivacaine 0.5 %; 80 mg methylPREDNISolone acetate 40 MG/ML Procedure, treatment alternatives, risks and benefits explained, specific risks discussed. Consent was given by the patient. Patient was prepped and draped in the usual sterile fashion.       Clinical Data: No additional findings.   Subjective: Chief Complaint  Patient presents with  . Left Knee - Follow-up  Patient presents today for a follow up on her left knee. She was here 4 months ago and received a cortisone injection on 10/15/2017. Patient states that it lasted about 51months and would like to get another one today. She has more pain with weightbearing and occasional swelling.   HPI  Review of Systems   Objective: Vital Signs: BP 128/82   Pulse 84   Ht 4\' 10"  (1.473 m)   Wt 134 lb (60.8 kg)   BMI 28.01 kg/m   Physical Exam  Ortho Exam left knee with small effusion.  Lacks a few degrees to full extension and flexed over 100 degrees without instability.  Palpable medial osteophytes no calf pain  Specialty Comments:  No specialty comments  available.  Imaging: No results found.   PMFS History: Patient Active Problem List   Diagnosis Date Noted  . CKD (chronic kidney disease) stage 3, GFR 30-59 ml/min (HCC) 04/05/2017  . Glaucoma 04/05/2017  . Obesity, Class I, BMI 30-34.9 05/08/2016  . Incisional hernia of anterior abdominal wall without obstruction or gangrene 05/08/2016  . Hyperlipidemia 05/07/2016  . Chronic pain disorder 10/29/2015  . Essential hypertension 10/29/2015  . Unstable gait 10/29/2015  . Osteoarthritis of left knee 10/29/2015  . Vertigo 10/29/2015  . Low back pain with radiation, unspecified laterality 10/29/2015   Past Medical History:  Diagnosis Date  . Arthritis   . Diverticulitis   . History of fainting spells of unknown cause   . Hypercholesteremia   . Hypertension   . Pulmonary emboli (HCC)     Family History  Problem Relation Age of Onset  . Diabetes Mother   . Diabetes Father     Past Surgical History:  Procedure Laterality Date  . APPENDECTOMY    . CARPAL TUNNEL RELEASE    . HERNIA REPAIR  2011   mesh removed  . TONSILLECTOMY     Social History   Occupational History  . Occupation: widowed  Tobacco Use  . Smoking status: Never Smoker  . Smokeless tobacco: Never Used  Substance and Sexual Activity  .  Alcohol use: No  . Drug use: No  . Sexual activity: Not Currently

## 2018-06-15 ENCOUNTER — Other Ambulatory Visit: Payer: Self-pay | Admitting: Family Medicine

## 2018-06-15 DIAGNOSIS — I10 Essential (primary) hypertension: Secondary | ICD-10-CM

## 2018-06-15 DIAGNOSIS — E785 Hyperlipidemia, unspecified: Secondary | ICD-10-CM

## 2018-06-21 ENCOUNTER — Ambulatory Visit (INDEPENDENT_AMBULATORY_CARE_PROVIDER_SITE_OTHER): Payer: Medicare Other | Admitting: Podiatry

## 2018-06-21 ENCOUNTER — Other Ambulatory Visit: Payer: Self-pay

## 2018-06-21 ENCOUNTER — Encounter: Payer: Self-pay | Admitting: Podiatry

## 2018-06-21 VITALS — Temp 97.7°F

## 2018-06-21 DIAGNOSIS — M79674 Pain in right toe(s): Secondary | ICD-10-CM | POA: Diagnosis not present

## 2018-06-21 DIAGNOSIS — G629 Polyneuropathy, unspecified: Secondary | ICD-10-CM

## 2018-06-21 DIAGNOSIS — M79675 Pain in left toe(s): Secondary | ICD-10-CM | POA: Diagnosis not present

## 2018-06-21 DIAGNOSIS — B351 Tinea unguium: Secondary | ICD-10-CM | POA: Diagnosis not present

## 2018-06-29 ENCOUNTER — Ambulatory Visit: Payer: Medicare Other | Admitting: Orthopaedic Surgery

## 2018-06-29 NOTE — Progress Notes (Signed)
Subjective: 85 year old female presents the office today with concerns of pain mostly to her right big toenail but all her nails are thick and discolored.  They are asking if his nails be trimmed or removed on the right big toenail.  Denies any redness or drainage or any signs of infection.  Area become painful with pressure in shoes. Denies any systemic complaints such as fevers, chills, nausea, vomiting. No acute changes since last appointment, and no other complaints at this time.   Objective: AAO x3, NAD DP/PT pulses palpable bilaterally, CRT less than 3 seconds All the nails are hypertrophic, dystrophic, discolored with yellow-brown discoloration particular the right hallux toenail no significant there is curling.  There is no edema, erythema, drainage or pus or any signs of infection no toenail sites.  Tenderness nails 1-5 bilaterally. No open lesions or pre-ulcerative lesions.  No pain with calf compression, swelling, warmth, erythema  Assessment: Symptomatic onychomycosis, onychodystrophy  Plan: -All treatment options discussed with the patient including all alternatives, risks, complications.  -We discussed toenail removal however after discussion they like to proceed with debridement.  I instructed debrided nails x10 without any complications or bleeding -Patient encouraged to call the office with any questions, concerns, change in symptoms.   Trula Slade DPM

## 2018-06-30 ENCOUNTER — Encounter: Payer: Self-pay | Admitting: Orthopaedic Surgery

## 2018-06-30 ENCOUNTER — Ambulatory Visit (INDEPENDENT_AMBULATORY_CARE_PROVIDER_SITE_OTHER): Payer: Medicare Other | Admitting: Orthopaedic Surgery

## 2018-06-30 ENCOUNTER — Other Ambulatory Visit: Payer: Self-pay

## 2018-06-30 ENCOUNTER — Ambulatory Visit: Payer: Medicare Other | Admitting: Orthopaedic Surgery

## 2018-06-30 VITALS — BP 157/75 | HR 66 | Ht <= 58 in | Wt 137.0 lb

## 2018-06-30 DIAGNOSIS — M1712 Unilateral primary osteoarthritis, left knee: Secondary | ICD-10-CM | POA: Diagnosis not present

## 2018-06-30 MED ORDER — LIDOCAINE HCL 1 % IJ SOLN
2.0000 mL | INTRAMUSCULAR | Status: AC | PRN
  Administered 2018-06-30: 2 mL

## 2018-06-30 MED ORDER — BUPIVACAINE HCL 0.25 % IJ SOLN
2.0000 mL | INTRAMUSCULAR | Status: AC | PRN
  Administered 2018-06-30: 2 mL via INTRA_ARTICULAR

## 2018-06-30 MED ORDER — METHYLPREDNISOLONE ACETATE 40 MG/ML IJ SUSP
80.0000 mg | INTRAMUSCULAR | Status: AC | PRN
  Administered 2018-06-30: 80 mg via INTRA_ARTICULAR

## 2018-06-30 NOTE — Progress Notes (Signed)
Office Visit Note   Patient: Deborah Bell           Date of Birth: 10-04-1928           MRN: [id number] Visit Date: 06/30/2018              Requested by: Bernerd Limbo, MD [address] [place] [place],  [place] [id number] PCP: Bernerd Limbo, MD   Assessment & Plan: Visit Diagnoses:  1. Primary osteoarthritis of left knee     Plan:  #1: Corticosteroid injection to the left knee was performed atraumatically.  Tolerated procedure well. #2: Discussed the use of Voltaren gel which is over-the-counter low-dose twice a day  Follow-Up Instructions: Return if symptoms worsen or fail to improve.   Orders:  No orders of the defined types were placed in this encounter.  No orders of the defined types were placed in this encounter.     Procedures: Large Joint Inj: L knee on 06/30/2018 11:26 AM Indications: pain and diagnostic evaluation Details: 25 G 1.5 in needle, anteromedial approach  Arthrogram: No  Medications: 2 mL lidocaine 1 %; 80 mg methylPREDNISolone acetate 40 MG/ML; 2 mL bupivacaine 0.25 % Procedure, treatment alternatives, risks and benefits explained, specific risks discussed. Consent was given by the patient. Immediately prior to procedure a time out was called to verify the correct patient, procedure, equipment, support staff and site/side marked as required. Patient was prepped and draped in the usual sterile fashion.       Clinical Data: No additional findings.   Subjective: Chief Complaint  Patient presents with  . Left Knee - Pain   HPI Patient presents today for recurrent left knee pain. She was here last in February and received a cortisone injection. Patient states that it has been hurting again for a couple months and wants to get another injection. She takes tylenol with arthritis for pain.    Review of Systems  Constitutional: Negative for fatigue.  HENT: Negative for ear pain.   Eyes: Negative for pain.  Respiratory: Negative  for shortness of breath.   Cardiovascular: Negative for leg swelling.  Gastrointestinal: Positive for constipation. Negative for diarrhea.  Endocrine: Negative for cold intolerance and heat intolerance.  Genitourinary: Negative for difficulty urinating.  Musculoskeletal: Positive for joint swelling.  Skin: Negative for rash.  Allergic/Immunologic: Negative for food allergies.  Neurological: Negative for weakness.  Hematological: Does not bruise/bleed easily.  Psychiatric/Behavioral: Positive for sleep disturbance.     Objective: Vital Signs: BP (!) 157/75   Pulse 66   Ht 4\' 10"  (1.473 m)   Wt 137 lb (62.1 kg)   BMI 28.63 kg/m   Physical Exam Constitutional:      Appearance: She is well-developed.  Eyes:     Pupils: Pupils are equal, round, and reactive to light.  Pulmonary:     Effort: Pulmonary effort is normal.  Skin:    General: Skin is warm and dry.  Neurological:     Mental Status: She is alert and oriented to person, place, and time.  Psychiatric:        Behavior: Behavior normal.     Ortho Exam  Today she has a mild effusion.  No warmth or erythema.  She lacks full extension by about 5 degrees.  Flexes to 90 degrees.  Crepitance with range of motion.  Specialty Comments:  No specialty comments available.  Imaging: No results found.   PMFS History: Current Outpatient Medications  Medication Sig Dispense Refill  .  amLODipine (NORVASC) 2.5 MG tablet Take 1 tablet (2.5 mg total) by mouth daily. 90 tablet 3  . benzonatate (TESSALON) 100 MG capsule Take 1 capsule (100 mg total) by mouth 2 (two) times daily. 20 capsule 0  . CALCIUM PO Take 1 tablet by mouth daily.    . NON FORMULARY as needed. [facility]  Onychomycosis Nail Lacquer -  Fluconazole 2%, Terbinafine 1% DMSO Apply to affected nail once daily Qty. 120 gm 3 refills     . simvastatin (ZOCOR) 20 MG tablet Take 1 tablet (20 mg total) by mouth daily. 90 tablet 3   No current  facility-administered medications for this visit.     Patient Active Problem List   Diagnosis Date Noted  . CKD (chronic kidney disease) stage 3, GFR 30-59 ml/min (HCC) 04/05/2017  . Glaucoma 04/05/2017  . Obesity, Class I, BMI 30-34.9 05/08/2016  . Incisional hernia of anterior abdominal wall without obstruction or gangrene 05/08/2016  . Hyperlipidemia 05/07/2016  . Chronic pain disorder 10/29/2015  . Essential hypertension 10/29/2015  . Unstable gait 10/29/2015  . Osteoarthritis of left knee 10/29/2015  . Vertigo 10/29/2015  . Low back pain with radiation, unspecified laterality 10/29/2015   Past Medical History:  Diagnosis Date  . Arthritis   . Diverticulitis   . History of fainting spells of unknown cause   . Hypercholesteremia   . Hypertension   . Pulmonary emboli (HCC)     Family History  Problem Relation Age of Onset  . Diabetes Mother   . Diabetes Father     Past Surgical History:  Procedure Laterality Date  . APPENDECTOMY    . CARPAL TUNNEL RELEASE    . HERNIA REPAIR  2011   mesh removed  . TONSILLECTOMY     Social History   Occupational History  . Occupation: widowed  Tobacco Use  . Smoking status: Never Smoker  . Smokeless tobacco: Never Used  Substance and Sexual Activity  . Alcohol use: No  . Drug use: No  . Sexual activity: Not Currently

## 2018-09-13 ENCOUNTER — Telehealth: Payer: Self-pay | Admitting: Physical Medicine and Rehabilitation

## 2018-09-13 NOTE — Telephone Encounter (Signed)
Scheduled for 9/21 at 1600 with driver and no blood thinners.

## 2018-09-13 NOTE — Telephone Encounter (Signed)
Yes ok 

## 2018-10-06 ENCOUNTER — Telehealth: Payer: Self-pay | Admitting: Orthopaedic Surgery

## 2018-10-06 NOTE — Telephone Encounter (Signed)
Deborah Bell left a voicemail stating her mom has an appointment on Tuesday, 10/11/18 with Dr. Durward Fortes and "she has a couple of issues she wants to discuss."  Deborah Bell requested a return call.

## 2018-10-06 NOTE — Telephone Encounter (Signed)
I spoke with patient's daughter. Her mother tested positive for Covid 19 on 09/29/2018. She is aware that she will have to be rescheduled to see Dr.Whitfield and understands why this must happen. Can you please call and reschedule her?

## 2018-10-10 ENCOUNTER — Encounter: Payer: Medicare Other | Admitting: Physical Medicine and Rehabilitation

## 2018-10-11 ENCOUNTER — Ambulatory Visit: Payer: Medicare Other | Admitting: Orthopaedic Surgery

## 2018-10-24 ENCOUNTER — Ambulatory Visit: Payer: Self-pay

## 2018-10-24 ENCOUNTER — Ambulatory Visit (INDEPENDENT_AMBULATORY_CARE_PROVIDER_SITE_OTHER): Payer: Medicare Other | Admitting: Physical Medicine and Rehabilitation

## 2018-10-24 ENCOUNTER — Encounter: Payer: Self-pay | Admitting: Physical Medicine and Rehabilitation

## 2018-10-24 VITALS — BP 146/83 | HR 93

## 2018-10-24 DIAGNOSIS — M48062 Spinal stenosis, lumbar region with neurogenic claudication: Secondary | ICD-10-CM

## 2018-10-24 DIAGNOSIS — M5416 Radiculopathy, lumbar region: Secondary | ICD-10-CM

## 2018-10-24 MED ORDER — METHYLPREDNISOLONE ACETATE 80 MG/ML IJ SUSP
80.0000 mg | Freq: Once | INTRAMUSCULAR | Status: AC
  Administered 2018-10-24: 80 mg

## 2018-10-24 NOTE — Progress Notes (Signed)
Deborah Bell - 85 y.o. female MRN [id number]  Date of birth: 03-31-1928  Office Visit Note: Visit Date: 10/24/2018 PCP: Bernerd Limbo, MD Referred by: Bernerd Limbo, MD  Subjective: Chief Complaint  Patient presents with  . Lower Back - Pain   HPI: Deborah Bell is a 85 y.o. female who comes in today For planned repeat L4-5 interlaminar steroid injection for moderate multifactorial stenosis with neurogenic claudication.  Prior injection in January did extremely well up until just recently.  Patient's had no trauma or new falls etc.  Exam today is really nonfocal she does ambulate with a but has good distal strength.  We will repeat the injection today and see how she does.  She will continue to follow-up with Dr. Joni Fears for orthopedic complaints particularly with her knees.  ROS Otherwise per HPI.  Assessment & Plan: Visit Diagnoses:  1. Lumbar radiculopathy   2. Spinal stenosis of lumbar region with neurogenic claudication     Plan: No additional findings.   Meds & Orders:  Meds ordered this encounter  Medications  . methylPREDNISolone acetate (DEPO-MEDROL) injection 80 mg    Orders Placed This Encounter  Procedures  . XR C-ARM NO REPORT  . Epidural Steroid injection    Follow-up: Return if symptoms worsen or fail to improve.   Procedures: No procedures performed  Lumbar Epidural Steroid Injection - Interlaminar Approach with Fluoroscopic Guidance  Patient: Deborah Bell      Date of Birth: 02/21/1928 MRN: [id number] PCP: Bernerd Limbo, MD      Visit Date: 10/24/2018   Universal Protocol:     Consent Given By: the patient  Position: PRONE  Additional Comments: Vital signs were monitored before and after the procedure. Patient was prepped and draped in the usual sterile fashion. The correct patient, procedure, and site was verified.   Injection Procedure Details:  Procedure Site One Meds Administered:  Meds ordered this encounter   Medications  . methylPREDNISolone acetate (DEPO-MEDROL) injection 80 mg     Laterality: Left  Location/Site:  L4-L5  Needle size: 20 G  Needle type: Tuohy  Needle Placement: Paramedian epidural  Findings:   -Comments: Excellent flow of contrast into the epidural space.  Procedure Details: Using a paramedian approach from the side mentioned above, the region overlying the inferior lamina was localized under fluoroscopic visualization and the soft tissues overlying this structure were infiltrated with 4 ml. of 1% Lidocaine without Epinephrine. The Tuohy needle was inserted into the epidural space using a paramedian approach.   The epidural space was localized using loss of resistance along with lateral and bi-planar fluoroscopic views.  After negative aspirate for air, blood, and CSF, a 2 ml. volume of Isovue-250 was injected into the epidural space and the flow of contrast was observed. Radiographs were obtained for documentation purposes.    The injectate was administered into the level noted above.   Additional Comments:  The patient tolerated the procedure well Dressing: 2 x 2 sterile gauze and Band-Aid    Post-procedure details: Patient was observed during the procedure. Post-procedure instructions were reviewed.  Patient left the clinic in stable condition.   Clinical History: MRI LUMBAR SPINE WITHOUT CONTRAST  TECHNIQUE: Multiplanar, multisequence MR imaging of the lumbar spine was performed. No intravenous contrast was administered.  COMPARISON:  Plain films 02/04/2016.  FINDINGS: Segmentation:  Standard.  Alignment: Severe degenerative scoliosis convex RIGHT related to loss of interspace height on the LEFT at L3-4 and L2-3. Leftward translation L1  on L2. Trace anterolisthesis L3-L4. As correlated with plain films, scoliosis measures greater than 30 degrees.  Vertebrae: Other than endplate reactive changes most prominent at L3-4, no worrisome or  significant osseous findings.  Conus medullaris and cauda equina: Conus extends to the L1 level. Conus and cauda equina appear normal.  Paraspinal and other soft tissues: BILATERAL renal cystic disease incompletely evaluated.  Disc levels:  L1-L2: Central protrusion. Posterior element hypertrophy, slightly worse on the LEFT. LEFT greater than RIGHT L2 nerve root impingement. LEFT L1 nerve root impingement due to foraminal narrowing. Mild to moderate stenosis is superimposed.  L2-L3: Annular bulge. Posterior element hypertrophy. Borderline stenosis. Asymmetric loss of interspace height on the LEFT. LEFT L2 and L3 nerve root impingement.  L3-L4: Near complete loss of interspace height at L3-4 on the LEFT. Marked osseous spurring. Posterior element hypertrophy. Central and leftward protrusion is superimposed. Trace anterolisthesis. Moderate to severe stenosis. Severe LEFT L3 and L4 nerve root impingement.  L4-L5: Good disc height and hydration. Posterior element hypertrophy. Annular bulge. Mild stenosis. Borderline subarticular zone narrowing on the RIGHT could affect the L5 nerve root.  L5-S1: Disc desiccation. Annular bulge. Facet arthropathy. No impingement.  IMPRESSION: The dominant abnormality is at L3-L4 where near complete loss of interspace height on the LEFT contributes to significant dextroconvex scoliosis (greater than 30 degrees), and bony overgrowth. Asymmetric posterior element hypertrophy along with leftward disc protrusion results in moderate to severe stenosis and LEFT L3/LEFT L4 nerve root impingement.  Similar less severe changes at L2-3 on the LEFT, with asymmetric loss of interspace is height in conjunction with annular bulge and posterior element hypertrophy result in L2 and L3 nerve root impingement at that level.  Leftward translation L1 on L2. Central protrusion with posterior element hypertrophy. Correlate clinically for symptomatic LEFT  L1 and LEFT L2 nerve root impingement.  Marked BILATERAL renal cystic disease, incompletely evaluated. If further investigation desired, consider sonography.   Electronically Signed   By: Staci Righter M.D.   On: 12/20/2016 07:56  02/04/2016 Lspine 4v Xray  AP and lateral lumbar spine x-ray shows moderate right-sided scoliosis  with a dextro rotatory component centered at about the L3-L4 region. There  is some rotation of the pelvis with some degenerative changes of the right  sacroiliac joint with fairly normal-appearing left sacroiliac joint. Hip  joints appear to show only mild degenerative changes with some decent  joint spaces. There are no fractures or dislocations.   She reports that she has never smoked. She has never used smokeless tobacco. No results for input(s): HGBA1C, LABURIC in the last 8760 hours.  Objective:  VS:  HT:    WT:   BMI:     BP:(!) 146/83  HR:93bpm  TEMP: ( )  RESP:  Physical Exam  Ortho Exam Imaging: No results found.  Past Medical/Family/Surgical/Social History: Medications & Allergies reviewed per EMR, new medications updated. Patient Active Problem List   Diagnosis Date Noted  . Malignant neoplasm of upper-inner quadrant of right breast in female, estrogen receptor positive ([facility]) 11/22/2018  . CKD (chronic kidney disease) stage 3, GFR 30-59 ml/min 04/05/2017  . Glaucoma 04/05/2017  . Obesity, Class I, BMI 30-34.9 05/08/2016  . Incisional hernia of anterior abdominal wall without obstruction or gangrene 05/08/2016  . Hyperlipidemia 05/07/2016  . Chronic pain disorder 10/29/2015  . Essential hypertension 10/29/2015  . Unstable gait 10/29/2015  . Osteoarthritis of left knee 10/29/2015  . Vertigo 10/29/2015  . Low back pain with radiation, unspecified laterality 10/29/2015  Past Medical History:  Diagnosis Date  . Arthritis   . Diverticulitis   . History of fainting spells of unknown cause   . Hypercholesteremia   . Hypertension    . Pulmonary emboli (HCC)    Family History  Problem Relation Age of Onset  . Diabetes Mother   . Diabetes Father    Past Surgical History:  Procedure Laterality Date  . APPENDECTOMY    . CARPAL TUNNEL RELEASE    . HERNIA REPAIR  2011   mesh removed  . TONSILLECTOMY     Social History   Occupational History  . Occupation: widowed  Tobacco Use  . Smoking status: Never Smoker  . Smokeless tobacco: Never Used  Substance and Sexual Activity  . Alcohol use: No  . Drug use: No  . Sexual activity: Not Currently

## 2018-10-24 NOTE — Progress Notes (Signed)
.  Numeric Pain Rating Scale and Functional Assessment Average Pain 7   In the last MONTH (on 0-10 scale) has pain interfered with the following?  1. General activity like being  able to carry out your everyday physical activities such as walking, climbing stairs, carrying groceries, or moving a chair?  Rating(8)   +Driver, -BT, -Dye Allergies.  

## 2018-11-01 ENCOUNTER — Other Ambulatory Visit: Payer: Self-pay | Admitting: Physician Assistant

## 2018-11-01 DIAGNOSIS — Z780 Asymptomatic menopausal state: Secondary | ICD-10-CM

## 2018-11-01 DIAGNOSIS — R2681 Unsteadiness on feet: Secondary | ICD-10-CM

## 2018-11-01 DIAGNOSIS — N649 Disorder of breast, unspecified: Secondary | ICD-10-CM

## 2018-11-10 ENCOUNTER — Other Ambulatory Visit: Payer: Medicare Other

## 2018-11-10 ENCOUNTER — Other Ambulatory Visit: Payer: Self-pay | Admitting: Physician Assistant

## 2018-11-10 ENCOUNTER — Ambulatory Visit
Admission: RE | Admit: 2018-11-10 | Discharge: 2018-11-10 | Disposition: A | Discharge status: Home / Self Care | Payer: Medicare Other | Source: Ambulatory Visit | Attending: Physician Assistant | Admitting: Physician Assistant

## 2018-11-10 ENCOUNTER — Other Ambulatory Visit: Payer: Self-pay

## 2018-11-10 DIAGNOSIS — N649 Disorder of breast, unspecified: Secondary | ICD-10-CM

## 2018-11-10 DIAGNOSIS — N6310 Unspecified lump in the right breast, unspecified quadrant: Secondary | ICD-10-CM

## 2018-11-10 DIAGNOSIS — R921 Mammographic calcification found on diagnostic imaging of breast: Secondary | ICD-10-CM

## 2018-11-10 DIAGNOSIS — R599 Enlarged lymph nodes, unspecified: Secondary | ICD-10-CM

## 2018-11-15 ENCOUNTER — Ambulatory Visit
Admission: RE | Admit: 2018-11-15 | Discharge: 2018-11-15 | Disposition: A | Discharge status: Home / Self Care | Payer: Medicare Other | Source: Ambulatory Visit | Attending: Physician Assistant | Admitting: Physician Assistant

## 2018-11-15 ENCOUNTER — Ambulatory Visit: Payer: Medicare Other | Admitting: Orthopaedic Surgery

## 2018-11-15 ENCOUNTER — Other Ambulatory Visit: Payer: Self-pay

## 2018-11-15 DIAGNOSIS — R921 Mammographic calcification found on diagnostic imaging of breast: Secondary | ICD-10-CM

## 2018-11-15 DIAGNOSIS — N6310 Unspecified lump in the right breast, unspecified quadrant: Secondary | ICD-10-CM

## 2018-11-15 DIAGNOSIS — R599 Enlarged lymph nodes, unspecified: Secondary | ICD-10-CM

## 2018-11-15 HISTORY — PX: BREAST BIOPSY: SHX20

## 2018-11-16 ENCOUNTER — Ambulatory Visit: Payer: Medicare Other | Admitting: Orthopaedic Surgery

## 2018-11-17 ENCOUNTER — Encounter: Payer: Self-pay | Admitting: Orthopaedic Surgery

## 2018-11-17 ENCOUNTER — Other Ambulatory Visit: Payer: Self-pay

## 2018-11-17 ENCOUNTER — Ambulatory Visit (INDEPENDENT_AMBULATORY_CARE_PROVIDER_SITE_OTHER): Payer: Medicare Other | Admitting: Orthopaedic Surgery

## 2018-11-17 VITALS — Ht <= 58 in | Wt 137.0 lb

## 2018-11-17 DIAGNOSIS — M1712 Unilateral primary osteoarthritis, left knee: Secondary | ICD-10-CM

## 2018-11-17 MED ORDER — METHYLPREDNISOLONE ACETATE 40 MG/ML IJ SUSP
80.0000 mg | INTRAMUSCULAR | Status: AC | PRN
  Administered 2018-11-17: 80 mg via INTRA_ARTICULAR

## 2018-11-17 MED ORDER — LIDOCAINE HCL 1 % IJ SOLN
2.0000 mL | INTRAMUSCULAR | Status: AC | PRN
  Administered 2018-11-17: 2 mL

## 2018-11-17 MED ORDER — BUPIVACAINE HCL 0.5 % IJ SOLN
2.0000 mL | INTRAMUSCULAR | Status: AC | PRN
  Administered 2018-11-17: 2 mL via INTRA_ARTICULAR

## 2018-11-17 NOTE — Progress Notes (Signed)
Office Visit Note   Patient: Deborah Bell           Date of Birth: 1928-10-04           MRN: [id number] Visit Date: 11/17/2018              Requested by: Bernerd Limbo, MD [address] [place],  [place] [id number] PCP: Bernerd Limbo, MD   Assessment & Plan: Visit Diagnoses:  1. Primary osteoarthritis of left knee     Plan: Will reinject left knee with cortisone.  Has had good relief in the past with her end-stage OA.  Not a candidate for knee replacement surgery  Follow-Up Instructions: Return if symptoms worsen or fail to improve.   Orders:  No orders of the defined types were placed in this encounter.  No orders of the defined types were placed in this encounter.     Procedures: Large Joint Inj: L knee on 11/17/2018 10:56 AM Indications: pain and diagnostic evaluation Details: 25 G 1.5 in needle, anteromedial approach  Arthrogram: No  Medications: 2 mL lidocaine 1 %; 2 mL bupivacaine 0.5 %; 80 mg methylPREDNISolone acetate 40 MG/ML Procedure, treatment alternatives, risks and benefits explained, specific risks discussed. Consent was given by the patient. Patient was prepped and draped in the usual sterile fashion.       Clinical Data: No additional findings.   Subjective: Chief Complaint  Patient presents with  . Left Knee - Pain  Patient presents today for recurrent left knee pain. She was last evaluated on 06/30/2018 and received a cortisone injection. She has been hurting for about two months again. She is not taking anything for pain.  Recently seen by Dr. Ernestina Patches for lumbar epidural steroid injection with excellent relief of her pain  HPI  Review of Systems  Constitutional: Negative for fatigue.  HENT: Negative for ear pain.   Eyes: Negative for pain.  Respiratory: Negative for shortness of breath.   Cardiovascular: Negative for leg swelling.  Gastrointestinal: Negative for constipation and diarrhea.  Endocrine: Negative for  cold intolerance and heat intolerance.  Genitourinary: Negative for difficulty urinating.  Musculoskeletal: Positive for joint swelling.  Skin: Negative for rash.  Allergic/Immunologic: Negative for food allergies.  Neurological: Negative for weakness.  Hematological: Does not bruise/bleed easily.  Psychiatric/Behavioral: Negative for sleep disturbance.     Objective: Vital Signs: Ht 4\' 10"  (1.473 m)   Wt 137 lb (62.1 kg)   BMI 28.63 kg/m   Physical Exam Constitutional:      Appearance: She is well-developed.  Eyes:     Pupils: Pupils are equal, round, and reactive to light.  Pulmonary:     Effort: Pulmonary effort is normal.  Skin:    General: Skin is warm and dry.  Neurological:     Mental Status: She is alert and oriented to person, place, and time.  Psychiatric:        Behavior: Behavior normal.     Ortho Exam awake alert and oriented x3.  Comfortable sitting.  Does walk with a slow and short based gait.  Pain is localized on the medial compartment of her left knee.  Possibly very small effusion.  Full extension and flexion about 100 degrees without instability.  Specialty Comments:  No specialty comments available.  Imaging: No results found.   PMFS History: Patient Active Problem List   Diagnosis Date Noted  . CKD (chronic kidney disease) stage 3, GFR 30-59 ml/min 04/05/2017  . Glaucoma 04/05/2017  .  Obesity, Class I, BMI 30-34.9 05/08/2016  . Incisional hernia of anterior abdominal wall without obstruction or gangrene 05/08/2016  . Hyperlipidemia 05/07/2016  . Chronic pain disorder 10/29/2015  . Essential hypertension 10/29/2015  . Unstable gait 10/29/2015  . Osteoarthritis of left knee 10/29/2015  . Vertigo 10/29/2015  . Low back pain with radiation, unspecified laterality 10/29/2015   Past Medical History:  Diagnosis Date  . Arthritis   . Diverticulitis   . History of fainting spells of unknown cause   . Hypercholesteremia   . Hypertension   .  Pulmonary emboli (HCC)     Family History  Problem Relation Age of Onset  . Diabetes Mother   . Diabetes Father     Past Surgical History:  Procedure Laterality Date  . APPENDECTOMY    . CARPAL TUNNEL RELEASE    . HERNIA REPAIR  2011   mesh removed  . TONSILLECTOMY     Social History   Occupational History  . Occupation: widowed  Tobacco Use  . Smoking status: Never Smoker  . Smokeless tobacco: Never Used  Substance and Sexual Activity  . Alcohol use: No  . Drug use: No  . Sexual activity: Not Currently

## 2018-11-22 ENCOUNTER — Telehealth: Payer: Self-pay | Admitting: Hematology and Oncology

## 2018-11-22 ENCOUNTER — Telehealth: Payer: Self-pay | Admitting: Radiation Oncology

## 2018-11-22 ENCOUNTER — Encounter: Payer: Self-pay | Admitting: *Deleted

## 2018-11-22 ENCOUNTER — Other Ambulatory Visit: Payer: Self-pay

## 2018-11-22 ENCOUNTER — Inpatient Hospital Stay: Payer: Medicare Other | Attending: Hematology and Oncology | Admitting: Hematology and Oncology

## 2018-11-22 DIAGNOSIS — Z170 Estrogen receptor positive status [ER+]: Secondary | ICD-10-CM | POA: Insufficient documentation

## 2018-11-22 DIAGNOSIS — C50211 Malignant neoplasm of upper-inner quadrant of right female breast: Secondary | ICD-10-CM | POA: Insufficient documentation

## 2018-11-22 MED ORDER — ANASTROZOLE 1 MG PO TABS: 1.0000 mg | ORAL_TABLET | Freq: Every day | ORAL | 3 refills | Status: DC

## 2018-11-22 NOTE — Telephone Encounter (Signed)
New message: ° ° °LVM for patient to return call to schedule appt from referral received. °

## 2018-11-22 NOTE — Telephone Encounter (Signed)
Confirmed today's new patient appointment with patient daughter.

## 2018-11-22 NOTE — Assessment & Plan Note (Addendum)
11/15/2018:16-monthhistory of enlarging palpable lump in the right breast, mammogram revealed spiculated mass with architectural distortion.  Mass measures 2 cm but with the spiculations measures 4 cm, extensive involvement of overlying skin, left breast 8 mm calcifications, biopsy right breast mass and lymph node positive for invasive lobular cancer, lymph node also positive with extranodal extension, ER 95%, PR 100%, Ki-67 2%, HER-2 2+ by IHC but FISH negative ratio 1.44, left breast biopsy benign  T4 N1 stage IIIB  Pathology and radiology counseling: Discussed with the patient, the details of pathology including the type of breast cancer,the clinical staging, the significance of ER, PR and HER-2/neu receptors and the implications for treatment. After reviewing the pathology in detail, we proceeded to discuss the different treatment options between surgery, radiation, chemotherapy, antiestrogen therapies.  Recommendation: 1.  Neoadjuvant antiestrogen therapy 2.  Follow-up breast conserving surgery with targeted node dissection 3.  Adjuvant radiation therapy 4.  Follow-up adjuvant antiestrogen therapy  We will obtain a breast MRI for further evaluation.  Anastrozole counseling:We discussed the risks and benefits of anti-estrogen therapy with aromatase inhibitors. These include but not limited to insomnia, hot flashes, mood changes, vaginal dryness, bone density loss, and weight gain. We strongly believe that the benefits far outweigh the risks. Patient understands these risks and consented to starting treatment. Planned treatment duration is 7 years.  Return to clinic in 1 month for follow-up.

## 2018-11-22 NOTE — Progress Notes (Signed)
[facility] CONSULT NOTE  Patient Care Team: Bernerd Limbo, MD as PCP - General (Family Medicine)  CHIEF COMPLAINTS/PURPOSE OF CONSULTATION:  Newly diagnosed breast cancer  HISTORY OF PRESENTING ILLNESS:  Deborah Bell 85 y.o. female is here because of recent diagnosis of right breast cancer.  Patient had a redness and skin rash on the skin for the past couple months.  Initially they thought it was related to a bruise.  However it started increasing in size and so her daughter brought her to medical care.  She underwent a mammogram and ultrasound.  She was noted to have a spiculated mass in the right breast 2 cm in size but with spiculations measured 4 cm with extensive involvement of the skin.  There was also enlarged lymph node in the axilla.  Biopsy of the mass in the lymph nodes were positive for invasive lobular cancer that was ER/PR positive HER-2 negative and Ki-67 of 2%.  She had an abnormality in the left breast with calcifications which on biopsy was benign.. She is in a wheelchair and her daughter brought her in to discuss her treatment plan.  She saw Dr. Brantley Stage and he recommended neoadjuvant antiestrogen therapy and then reevaluate.  I reviewed her records extensively and collaborated the history with the patient.  SUMMARY OF ONCOLOGIC HISTORY: Oncology History  Malignant neoplasm of upper-inner quadrant of right breast in female, estrogen receptor positive ([place])  11/15/2018 Initial Diagnosis   62-monthhistory of enlarging palpable lump in the right breast, mammogram revealed spiculated mass with architectural distortion.  Mass measures 2 cm but with the spiculations measures 4 cm, extensive involvement of overlying skin, left breast 8 mm calcifications, biopsy right breast mass and lymph node positive for invasive lobular cancer, lymph node also positive with extranodal extension, ER 95%, PR 100%, Ki-67 2%, HER-2 2+ by IHC but FISH negative ratio 1.44, left breast  biopsy benign   11/22/2018 Cancer Staging   Staging form: Breast, AJCC 8th Edition - Clinical stage from 11/22/2018: Stage IIIB (cT4, cN1, cM0, G2, ER+, PR+, HER2-) - Signed by GNicholas Lose MD on 11/22/2018      MEDICAL HISTORY:  Past Medical History:  Diagnosis Date  . Arthritis   . Diverticulitis   . History of fainting spells of unknown cause   . Hypercholesteremia   . Hypertension   . [place]     SURGICAL HISTORY: Past Surgical History:  Procedure Laterality Date  . APPENDECTOMY    . CARPAL TUNNEL RELEASE    . HERNIA REPAIR  2011   mesh removed  . TONSILLECTOMY      SOCIAL HISTORY: Social History   Socioeconomic History  . Marital status: Legally Separated    Spouse name: Not on file  . Number of children: 3  . Years of education: Not on file  . Highest education level: Not on file  Occupational History  . Occupation: widowed  Social Needs  . Financial resource strain: Not on file  . Food insecurity    Worry: Not on file    Inability: Not on file  . Transportation needs    Medical: Not on file    Non-medical: Not on file  Tobacco Use  . Smoking status: Never Smoker  . Smokeless tobacco: Never Used  Substance and Sexual Activity  . Alcohol use: No  . Drug use: No  . Sexual activity: Not Currently  Lifestyle  . Physical activity    Days per week: Not on  file    Minutes per session: Not on file  . Stress: Not on file  Relationships  . Social Herbalist on phone: Not on file    Gets together: Not on file    Attends religious service: Not on file    Active member of club or organization: Not on file    Attends meetings of clubs or organizations: Not on file    Relationship status: Not on file  . Intimate partner violence    Fear of current or ex partner: Not on file    Emotionally abused: Not on file    Physically abused: Not on file    Forced sexual activity: Not on file  Other Topics Concern  . Not on file  Social  History Narrative   Patient is right-handed. She lives with her daughter.    FAMILY HISTORY: Family History  Problem Relation Age of Onset  . Diabetes Mother   . Diabetes Father     ALLERGIES:  has No Known Allergies.  MEDICATIONS:  Current Outpatient Medications  Medication Sig Dispense Refill  . amLODipine (NORVASC) 2.5 MG tablet Take 1 tablet (2.5 mg total) by mouth daily. 90 tablet 3  . anastrozole (ARIMIDEX) 1 MG tablet Take 1 tablet (1 mg total) by mouth daily. 90 tablet 3  . benzonatate (TESSALON) 100 MG capsule Take 1 capsule (100 mg total) by mouth 2 (two) times daily. 20 capsule 0  . CALCIUM PO Take 1 tablet by mouth daily.    . NON FORMULARY as needed. [facility]  Onychomycosis Nail Lacquer -  Fluconazole 2%, Terbinafine 1% DMSO Apply to affected nail once daily Qty. 120 gm 3 refills     . simvastatin (ZOCOR) 20 MG tablet Take 1 tablet (20 mg total) by mouth daily. 90 tablet 3   Current Facility-Administered Medications  Medication Dose Route Frequency Provider Last Rate Last Dose  . methylPREDNISolone acetate (DEPO-MEDROL) injection 80 mg  80 mg Other Once Magnus Sinning, MD        REVIEW OF SYSTEMS:   Constitutional: Denies fevers, chills or abnormal night sweats Eyes: Denies blurriness of vision, double vision or watery eyes Ears, nose, mouth, throat, and face: Denies mucositis or sore throat Respiratory: Denies cough, dyspnea or wheezes Cardiovascular: Denies palpitation, chest discomfort or lower extremity swelling Gastrointestinal:  Denies nausea, heartburn or change in bowel habits Skin: Denies abnormal skin rashes Lymphatics: Denies new lymphadenopathy or easy bruising Neurological:Denies numbness, tingling or new weaknesses Behavioral/Psych: Mood is stable, no new changes  Breast: Erythematous rash in the right breast upper quadrant All other systems were reviewed with the patient and are negative.  PHYSICAL EXAMINATION: ECOG PERFORMANCE  STATUS: 2 - Symptomatic, <50% confined to bed  Vitals:   11/22/18 1349  BP: (!) 190/77  Pulse: 71  Resp: 17  Temp: 98.9 F (37.2 C)  SpO2: 97%   Filed Weights   11/22/18 1349  Weight: 136 lb 3.2 oz (61.8 kg)    GENERAL:alert, no distress and comfortable SKIN: skin color, texture, turgor are normal, no rashes or significant lesions EYES: normal, conjunctiva are pink and non-injected, sclera clear OROPHARYNX:no exudate, no erythema and lips, buccal mucosa, and tongue normal  NECK: supple, thyroid normal size, non-tender, without nodularity LYMPH:  no palpable lymphadenopathy in the cervical, axillary or inguinal LUNGS: clear to auscultation and percussion with normal breathing effort HEART: regular rate & rhythm and no murmurs and no lower extremity edema ABDOMEN:abdomen soft, non-tender and normal bowel  sounds Musculoskeletal:no cyanosis of digits and no clubbing  PSYCH: alert & oriented x 3 with fluent speech NEURO: no focal motor/sensory deficits BREAST: Palpable redness in the right breast.  It measured 4 x 5.5 cm no palpable axillary or supraclavicular lymphadenopathy (exam performed in the presence of a chaperone)     LABORATORY DATA:  I have reviewed the data as listed Lab Results  Component Value Date   WBC 6.0 07/20/2017   HGB 13.6 07/21/2017   HCT 40.0 07/21/2017   MCV 92.7 07/20/2017   PLT 187.0 07/20/2017   Lab Results  Component Value Date   NA 143 10/04/2017   K 3.8 10/04/2017   CL 103 10/04/2017   CO2 36 (H) 10/04/2017    RADIOGRAPHIC STUDIES: I have personally reviewed the radiological reports and agreed with the findings in the report.  ASSESSMENT AND PLAN:  Malignant neoplasm of upper-inner quadrant of right breast in female, estrogen receptor positive ([place]) 11/15/2018:78-monthhistory of enlarging palpable lump in the right breast, mammogram revealed spiculated mass with architectural distortion.  Mass measures 2 cm but with the spiculations  measures 4 cm, extensive involvement of overlying skin, left breast 8 mm calcifications, biopsy right breast mass and lymph node positive for invasive lobular cancer, lymph node also positive with extranodal extension, ER 95%, PR 100%, Ki-67 2%, HER-2 2+ by IHC but FISH negative ratio 1.44, left breast biopsy benign  T4 N1 stage IIIB  Pathology and radiology counseling: Discussed with the patient, the details of pathology including the type of breast cancer,the clinical staging, the significance of ER, PR and HER-2/neu receptors and the implications for treatment. After reviewing the pathology in detail, we proceeded to discuss the different treatment options between surgery, radiation, chemotherapy, antiestrogen therapies.  Recommendation: 1.  Neoadjuvant antiestrogen therapy 2.  Follow-up breast conserving surgery with targeted node dissection 3.  Adjuvant radiation therapy 4.  Follow-up adjuvant antiestrogen therapy  We will obtain a breast MRI for further evaluation.  Anastrozole counseling:We discussed the risks and benefits of anti-estrogen therapy with aromatase inhibitors. These include but not limited to insomnia, hot flashes, mood changes, vaginal dryness, bone density loss, and weight gain. We strongly believe that the benefits far outweigh the risks. Patient understands these risks and consented to starting treatment. Planned treatment duration is 7 years.  I discussed with the patient and her daughter that sometimes we may keep her on antiestrogen therapy and never perform surgery.  It was felt that the surgery would have higher risks. Return to clinic in 1 month for follow-up.    All questions were answered. The patient knows to call the clinic with any problems, questions or concerns.    VHarriette Ohara MD 11/22/18

## 2018-11-23 ENCOUNTER — Telehealth: Payer: Self-pay | Admitting: Hematology and Oncology

## 2018-11-23 ENCOUNTER — Encounter: Payer: Self-pay | Admitting: *Deleted

## 2018-11-23 NOTE — Telephone Encounter (Signed)
I left a message regarding schedule  

## 2018-11-23 NOTE — Procedures (Signed)
Lumbar Epidural Steroid Injection - Interlaminar Approach with Fluoroscopic Guidance  Patient: Deborah Bell      Date of Birth: [birth year] MRN: [id number] PCP: Bernerd Limbo, MD      Visit Date: 10/24/2018   Universal Protocol:     Consent Given By: the patient  Position: PRONE  Additional Comments: Vital signs were monitored before and after the procedure. Patient was prepped and draped in the usual sterile fashion. The correct patient, procedure, and site was verified.   Injection Procedure Details:  Procedure Site One Meds Administered:  Meds ordered this encounter  Medications  . methylPREDNISolone acetate (DEPO-MEDROL) injection 80 mg     Laterality: Left  Location/Site:  L4-L5  Needle size: 20 G  Needle type: Tuohy  Needle Placement: Paramedian epidural  Findings:   -Comments: Excellent flow of contrast into the epidural space.  Procedure Details: Using a paramedian approach from the side mentioned above, the region overlying the inferior lamina was localized under fluoroscopic visualization and the soft tissues overlying this structure were infiltrated with 4 ml. of 1% Lidocaine without Epinephrine. The Tuohy needle was inserted into the epidural space using a paramedian approach.   The epidural space was localized using loss of resistance along with lateral and bi-planar fluoroscopic views.  After negative aspirate for air, blood, and CSF, a 2 ml. volume of Isovue-250 was injected into the epidural space and the flow of contrast was observed. Radiographs were obtained for documentation purposes.    The injectate was administered into the level noted above.   Additional Comments:  The patient tolerated the procedure well Dressing: 2 x 2 sterile gauze and Band-Aid    Post-procedure details: Patient was observed during the procedure. Post-procedure instructions were reviewed.  Patient left the clinic in stable condition.

## 2018-12-06 NOTE — Progress Notes (Signed)
Location of Breast Cancer: Right Breast  Histology per Pathology Report:  11/15/18 Diagnosis 1. Breast, right, needle core biopsy, mass at 12:30 o'clock - INVASIVE MAMMARY CARCINOMA, GRADE 2-3. SEE NOTE 2. Lymph node, needle/core biopsy, right axilla - METASTATIC MAMMARY CARCINOMA TO A LYMPH NODE WITH EXTRANODAL EXTENSION. SEE NOTE 3. Breast, left, needle core biopsy, calcifications upper outer quadrant - MILD FIBROCYSTIC CHANGE WITH DYSTROPHIC CALCIFICATIONS - NEGATIVE FOR CARCINOMA  Receptor Status: ER(95%), PR (100%), Her2-neu (NEG), Ki-(2%)  Did patient present with symptoms or was this found on screening mammography?: She had a several year history of a palpable lump that was enlarging in her right breast.  Past/Anticipated interventions by surgeon, if any: Dr. Brantley Stage 11/21/18   Past/Anticipated interventions by medical oncology, if any:  11/22/18 Dr. Lindi Adie Recommendation: 1.  Neoadjuvant antiestrogen therapy 2.  Follow-up breast conserving surgery with targeted node dissection 3.  Adjuvant radiation therapy 4.  Follow-up adjuvant antiestrogen therapy  We will obtain a breast MRI for further evaluation  Lymphedema issues, if any:  N/A  Pain issues, if any:  She has lower back pain, and has been recently diagnosed with a UTI.   SAFETY ISSUES:  Prior radiation? No  Pacemaker/ICD? No  Possible current pregnancy? No  Is the patient on methotrexate? No  Current Complaints / other details:    MRI completed 12/13/18  Avey Mcmanamon, Stephani Police, RN 12/06/2018,1:56 PM

## 2018-12-07 ENCOUNTER — Other Ambulatory Visit: Payer: Medicare Other

## 2018-12-08 NOTE — Progress Notes (Signed)
Radiation Oncology         [phone] ________________________________  Initial outpatient Telephone Consultation by telephone as patient was unable to access MyChart video during pandemic precautions   Name: Deborah Bell MRN: [id number]  Date: 12/09/2018  DOB: 06-Feb-1928  DV:VOHYWV, Shanon Brow, MD  Erroll Luna, MD   REFERRING PHYSICIAN: Erroll Luna, MD  DIAGNOSIS:    ICD-10-CM   1. Malignant neoplasm of upper-inner quadrant of right breast in female, estrogen receptor positive ([place])  C50.211    Z17.0     Cancer Staging Malignant neoplasm of upper-inner quadrant of right breast in female, estrogen receptor positive ([place]) Staging form: Breast, AJCC 8th Edition - Clinical stage from 11/22/2018: Stage IIIB (cT4, cN1, cM0, G2, ER+, PR+, HER2-) - Signed by Nicholas Lose, MD on 11/22/2018  CHIEF COMPLAINT: Here to discuss management of right breast cancer  HISTORY OF PRESENT ILLNESS::Deborah Bell is a 85 y.o. female who presented with breast abnormality on the following imaging: diagnostic mammogram on the date of 11/10/2018.  Symptoms, if any, at that time, were: enlarging palpable lump in the upper right breast.   Ultrasound of breast on 11/10/2018 revealed 5 cm mass involving the upper right breast with extensive skin involvement; indeterminate solitary right axillary lymph node; indeterminate calcifications involving the upper left breast.   Biopsy on date of 11/15/2018 showed invasive mammary carcinoma, e-cadherin negative.  ER status: positive; PR status positive, Her2 status negative by FISH; Grade 2-3. Biopsied right lymph node showed metastatic carcinoma with extranodal extension. Biopsy of left breast calcifications was negative for carcinoma.  She saw Dr. Lindi Adie in consultation on 11/22/2018. He recommended starting the patient on neoadjuvant antiestrogen therapy.  She was started on anastrozole at that visit.  She is scheduled for breast MRI on 12/13/2018.  She  has dementia, per her daughter, Holland Commons - entire history provided by phone by [facility] today.  PREVIOUS RADIATION THERAPY: No  PAST MEDICAL HISTORY:  has a past medical history of Arthritis, Diverticulitis, History of fainting spells of unknown cause, Hypercholesteremia, Hypertension, and Pulmonary emboli ([place]).    PAST SURGICAL HISTORY: Past Surgical History:  Procedure Laterality Date   APPENDECTOMY     CARPAL TUNNEL RELEASE     HERNIA REPAIR  2011   mesh removed   TONSILLECTOMY      FAMILY HISTORY: family history includes Diabetes in her father and mother.  SOCIAL HISTORY:  reports that she has never smoked. She has never used smokeless tobacco. She reports that she does not drink alcohol or use drugs.  ALLERGIES: Patient has no known allergies.  MEDICATIONS:  Current Outpatient Medications  Medication Sig Dispense Refill   amLODipine (NORVASC) 2.5 MG tablet Take 1 tablet (2.5 mg total) by mouth daily. 90 tablet 3   anastrozole (ARIMIDEX) 1 MG tablet Take 1 tablet (1 mg total) by mouth daily. 90 tablet 3   CALCIUM PO Take 1 tablet by mouth daily.     Multiple Vitamins-Minerals (MULTIVITAMIN WITH MINERALS) tablet Take 1 tablet by mouth daily.     NON FORMULARY as needed. [facility]  Onychomycosis Nail Lacquer -  Fluconazole 2%, Terbinafine 1% DMSO Apply to affected nail once daily Qty. 120 gm 3 refills      simvastatin (ZOCOR) 20 MG tablet Take 1 tablet (20 mg total) by mouth daily. 90 tablet 3   benzonatate (TESSALON) 100 MG capsule Take 1 capsule (100 mg total) by mouth 2 (two) times daily. (Patient not taking: Reported on 12/09/2018) 20 capsule  0   No current facility-administered medications for this encounter.     REVIEW OF SYSTEMS: as above.     PHYSICAL EXAM:  vitals were not taken for this visit.      LABORATORY DATA:  Lab Results  Component Value Date   WBC 6.0 07/20/2017   HGB 13.6 07/21/2017   HCT 40.0 07/21/2017   MCV 92.7 07/20/2017    PLT 187.0 07/20/2017   CMP     Component Value Date/Time   NA 143 10/04/2017 1210   NA 144 09/21/2015   K 3.8 10/04/2017 1210   CL 103 10/04/2017 1210   CO2 36 (H) 10/04/2017 1210   GLUCOSE 108 (H) 10/04/2017 1210   BUN 19 10/04/2017 1210   BUN 8 09/21/2015   CREATININE 1.04 10/04/2017 1210   CALCIUM 10.2 10/04/2017 1210   PROT 7.7 10/04/2017 1210   ALBUMIN 4.2 10/04/2017 1210   AST 23 10/04/2017 1210   ALT 13 10/04/2017 1210   ALKPHOS 79 10/04/2017 1210   BILITOT 0.4 10/04/2017 1210   GFRNONAA 55 (L) 10/26/2015 1316   GFRAA >60 10/26/2015 1316         RADIOGRAPHY: [place] Axillary Node Core Biopsy Right  Addendum Date: 11/16/2018   ADDENDUM REPORT: 11/16/2018 14:03 ADDENDUM: Pathology revealed 1- GRADE II-III INVASIVE MAMMARY CARCINOMA- SEE NOTE, of the RIGHT breast mass at 12:30 o'clock. This was found to be concordant by Dr. Ammie Ferrier. Pathology revealed 2- METASTATIC MAMMARY CARCINOMA TO A LYMPH NODE WITH EXTRANODAL EXTENSION- SEE NOTE, of the RIGHT axilla. This was found to be concordant by Dr. Ammie Ferrier. Diagnosis Note: 1. and 2. Carcinoma measures 13 mm in greatest linear dimension. A breast prognostic profile and immunohistochemical stain for E-cadherin are pending and will be reported in an addendum. Pathology revealed MILD FIBROCYSTIC CHANGE WITH DYSTROPHIC CALCIFICATIONS- NEGATIVE FOR CARCINOMA of the LEFT breast calcifications, upper outer quadrant. This was found to be concordant by Dr. Ammie Ferrier. Pathology results were discussed with the patient's daughter, Holland Commons [facility]) by telephone. She reported the patient doing well after the biopsies with tenderness at the sites. Post biopsy instructions and care were reviewed and questions were answered. She was encouraged to call [facility] for any additional concerns. Surgical consultation has been arranged with Dr. Erroll Luna at [facility] on November 21, 2018. Pathology  results reported by Stacie Acres, RN on 11/16/2018. Electronically Signed   By: Ammie Ferrier M.D.   On: 11/16/2018 14:03   Result Date: 11/16/2018 CLINICAL DATA:  85 year old female presenting for ultrasound-guided biopsy of a right breast mass and right axillary lymph node. EXAM: ULTRASOUND GUIDED RIGHT BREAST CORE NEEDLE BIOPSY COMPARISON:  Previous exam(s). FINDINGS: I met with the patient and we discussed the procedure of ultrasound-guided biopsy, including benefits and alternatives. We discussed the high likelihood of a successful procedure. We discussed the risks of the procedure, including infection, bleeding, tissue injury, clip migration, and inadequate sampling. Informed written consent was given. The usual time-out protocol was performed immediately prior to the procedure. #1 Lesion quadrant: Upper inner quadrant Using sterile technique and 1% Lidocaine as local anesthetic, under direct ultrasound visualization, a 14 gauge spring-loaded device was used to perform biopsy of a mass in the right breast at 12:30 using an inferior approach. At the conclusion of the procedure a ribbon shaped tissue marker clip was deployed into the biopsy cavity. -------------------------------------------------------------------------------------------------------------------------------------------- #2 Lesion quadrant: Right axilla Using sterile technique and 1% Lidocaine as local anesthetic, under  direct ultrasound visualization, a 14 gauge spring-loaded device was used to perform biopsy of a right axillary lymph node using a lateral approach. At the conclusion of the procedure a HydroMARK tissue marker clip was deployed into the biopsy cavity. Follow up 2 view mammogram was performed and dictated separately. IMPRESSION: 1. Ultrasound guided biopsy of a right breast mass at 12:30. No apparent complications. 2. Ultrasound guided biopsy of a right axillary lymph node. No apparent complications. Electronically Signed:  By: Ammie Ferrier M.D. On: 11/15/2018 08:43   Mm Clip Placement Left  Result Date: 11/15/2018 CLINICAL DATA:  Post biopsy mammogram of the bilateral breasts for clip placement. EXAM: DIAGNOSTIC BILATERAL MAMMOGRAM POST ULTRASOUND AND STEREOTACTIC BIOPSY COMPARISON:  Previous exam(s). FINDINGS: Mammographic images were obtained following ultrasound guided biopsy of a right breast mass and a right axillary lymph node and stereotactic guided biopsy of left breast calcifications. The biopsy marking clips are in expected position at the sites of biopsy in both breasts. IMPRESSION: 1. Appropriate positioning of the ribbon shaped biopsy marking clip at the site of biopsy in the right breast at 12:30. 2. Appropriate positioning of the [facility] spring shaped biopsy marking clip at the site of biopsy in the right axilla. 3. Appropriate positioning of the coil shaped biopsy marking clip at the site of biopsy in the upper-outer left breast. Final Assessment: Post Procedure Mammograms for Marker Placement Electronically Signed   By: Ammie Ferrier M.D.   On: 11/15/2018 09:18   Mm Clip Placement Right  Result Date: 11/15/2018 CLINICAL DATA:  Post biopsy mammogram of the bilateral breasts for clip placement. EXAM: DIAGNOSTIC BILATERAL MAMMOGRAM POST ULTRASOUND AND STEREOTACTIC BIOPSY COMPARISON:  Previous exam(s). FINDINGS: Mammographic images were obtained following ultrasound guided biopsy of a right breast mass and a right axillary lymph node and stereotactic guided biopsy of left breast calcifications. The biopsy marking clips are in expected position at the sites of biopsy in both breasts. IMPRESSION: 1. Appropriate positioning of the ribbon shaped biopsy marking clip at the site of biopsy in the right breast at 12:30. 2. Appropriate positioning of the [facility] spring shaped biopsy marking clip at the site of biopsy in the right axilla. 3. Appropriate positioning of the coil shaped biopsy marking clip at  the site of biopsy in the upper-outer left breast. Final Assessment: Post Procedure Mammograms for Marker Placement Electronically Signed   By: Ammie Ferrier M.D.   On: 11/15/2018 09:18   Mm Lt Breast Bx W Loc Dev 1st Lesion Image Bx Spec Stereo Guide  Addendum Date: 11/16/2018   ADDENDUM REPORT: 11/16/2018 14:05 ADDENDUM: Pathology revealed 1- GRADE II-III INVASIVE MAMMARY CARCINOMA- SEE NOTE, of the RIGHT breast mass at 12:30 o'clock. This was found to be concordant by Dr. Ammie Ferrier. Pathology revealed 2- METASTATIC MAMMARY CARCINOMA TO A LYMPH NODE WITH EXTRANODAL EXTENSION- SEE NOTE, of the RIGHT axilla. This was found to be concordant by Dr. Ammie Ferrier. Diagnosis Note: 1. and 2. Carcinoma measures 13 mm in greatest linear dimension. A breast prognostic profile and immunohistochemical stain for E-cadherin are pending and will be reported in an addendum. Pathology revealed MILD FIBROCYSTIC CHANGE WITH DYSTROPHIC CALCIFICATIONS- NEGATIVE FOR CARCINOMA of the LEFT breast calcifications, upper outer quadrant. This was found to be concordant by Dr. Ammie Ferrier. Pathology results were discussed with the patient's daughter, Holland Commons [facility]) by telephone. She reported the patient doing well after the biopsies with tenderness at the sites. Post biopsy instructions and care were reviewed and questions were answered. She  was encouraged to call [facility] for any additional concerns. Surgical consultation has been arranged with Dr. Erroll Luna at [facility] on November 21, 2018. Pathology results reported by Stacie Acres, RN on 11/16/2018. Electronically Signed   By: Ammie Ferrier M.D.   On: 11/16/2018 14:05   Result Date: 11/16/2018 CLINICAL DATA:  85 year old female presenting for stereotactic biopsy of left breast calcifications. EXAM: LEFT BREAST STEREOTACTIC CORE NEEDLE BIOPSY COMPARISON:  Previous exams. FINDINGS: The patient and I discussed the  procedure of stereotactic-guided biopsy including benefits and alternatives. We discussed the high likelihood of a successful procedure. We discussed the risks of the procedure including infection, bleeding, tissue injury, clip migration, and inadequate sampling. Informed written consent was given. The usual time out protocol was performed immediately prior to the procedure. Using sterile technique and 1% Lidocaine as local anesthetic, under stereotactic guidance, a 9 gauge vacuum assisted device was used to perform core needle biopsy of upper-outer left breast using a superior approach. Specimen radiograph was performed showing calcifications in the core samples. Specimens with calcifications are identified for pathology. Lesion quadrant: Upper-outer quadrant At the conclusion of the procedure, coil shaped tissue marker clip was deployed into the biopsy cavity. Follow-up 2-view mammogram was performed and dictated separately. IMPRESSION: Stereotactic-guided biopsy of calcifications in the upper-outer left breast. No apparent complications. Electronically Signed: By: Ammie Ferrier M.D. On: 11/15/2018 09:08   [place] Rt Breast Bx W Loc Dev 1st Lesion Img Bx Spec US Guide  Addendum Date: 11/16/2018   ADDENDUM REPORT: 11/16/2018 14:03 ADDENDUM: Pathology revealed 1- GRADE II-III INVASIVE MAMMARY CARCINOMA- SEE NOTE, of the RIGHT breast mass at 12:30 o'clock. This was found to be concordant by Dr. Ammie Ferrier. Pathology revealed 2- METASTATIC MAMMARY CARCINOMA TO A LYMPH NODE WITH EXTRANODAL EXTENSION- SEE NOTE, of the RIGHT axilla. This was found to be concordant by Dr. Ammie Ferrier. Diagnosis Note: 1. and 2. Carcinoma measures 13 mm in greatest linear dimension. A breast prognostic profile and immunohistochemical stain for E-cadherin are pending and will be reported in an addendum. Pathology revealed MILD FIBROCYSTIC CHANGE WITH DYSTROPHIC CALCIFICATIONS- NEGATIVE FOR CARCINOMA of the LEFT breast  calcifications, upper outer quadrant. This was found to be concordant by Dr. Ammie Ferrier. Pathology results were discussed with the patient's daughter, Holland Commons [facility]) by telephone. She reported the patient doing well after the biopsies with tenderness at the sites. Post biopsy instructions and care were reviewed and questions were answered. She was encouraged to call [facility] for any additional concerns. Surgical consultation has been arranged with Dr. Erroll Luna at [facility] on November 21, 2018. Pathology results reported by Stacie Acres, RN on 11/16/2018. Electronically Signed   By: Ammie Ferrier M.D.   On: 11/16/2018 14:03   Result Date: 11/16/2018 CLINICAL DATA:  85 year old female presenting for ultrasound-guided biopsy of a right breast mass and right axillary lymph node. EXAM: ULTRASOUND GUIDED RIGHT BREAST CORE NEEDLE BIOPSY COMPARISON:  Previous exam(s). FINDINGS: I met with the patient and we discussed the procedure of ultrasound-guided biopsy, including benefits and alternatives. We discussed the high likelihood of a successful procedure. We discussed the risks of the procedure, including infection, bleeding, tissue injury, clip migration, and inadequate sampling. Informed written consent was given. The usual time-out protocol was performed immediately prior to the procedure. #1 Lesion quadrant: Upper inner quadrant Using sterile technique and 1% Lidocaine as local anesthetic, under direct ultrasound visualization, a 14 gauge spring-loaded  device was used to perform biopsy of a mass in the right breast at 12:30 using an inferior approach. At the conclusion of the procedure a ribbon shaped tissue marker clip was deployed into the biopsy cavity. -------------------------------------------------------------------------------------------------------------------------------------------- #2 Lesion quadrant: Right axilla Using sterile technique and 1%  Lidocaine as local anesthetic, under direct ultrasound visualization, a 14 gauge spring-loaded device was used to perform biopsy of a right axillary lymph node using a lateral approach. At the conclusion of the procedure a HydroMARK tissue marker clip was deployed into the biopsy cavity. Follow up 2 view mammogram was performed and dictated separately. IMPRESSION: 1. Ultrasound guided biopsy of a right breast mass at 12:30. No apparent complications. 2. Ultrasound guided biopsy of a right axillary lymph node. No apparent complications. Electronically Signed: By: Ammie Ferrier M.D. On: 11/15/2018 08:43      IMPRESSION/PLAN: Right breast cancer   Although the patient has a suboptimal performance status, old age, and other medical issues, the goal is for her to undergo surgery after she is on antiestrogens.  MRI is pending.  Is not yet clear whether she will need a mastectomy or be a candidate for breast conservation.  Given the locally advanced nature of her disease, I would like to reevaluate the patient after surgery to strongly consider adjuvant radiotherapy.  This will likely be a 1-1/28-monthcourse of treatment to reduce the risk of local regional recurrence in the breast or chest wall, and surrounding lymph nodes, by two thirds.  We spoke about acute effects including skin irritation and fatigue as well as much less common late effects including internal organ injury or irritation. We spoke about the latest technology that is used to minimize the risk of late effects for patients undergoing radiotherapy to the breast or chest wall. No guarantees of treatment were given.   I look forward to participating in the patient's care. I will await her referral back to me for postoperative follow-up and eventual CT simulation/treatment planning.  All questions were answered to the patient's daughter's satisfaction.   This encounter was provided by telemedicine platform by telephone as patient was unable  to access MyChart video during pandemic precautions The patient has given verbal consent for this type of encounter and has been advised to only accept a meeting of this type in a secure network environment. The time spent during this encounter was 21 minutes. The attendants for this meeting include SEppie Gibson and MHolland Commons(daughter) and CFLOYCE BUJAK  During the encounter, [facility].  Hajer L Ailes was located at home.   __________________________________________   SEppie Gibson MD   This document serves as a record of services personally performed by SEppie Gibson MD. It was created on her behalf by KWilburn Mylar a trained medical scribe. The creation of this record is based on the scribe's personal observations and the provider's statements to them. This document has been checked and approved by the attending provider.

## 2018-12-09 ENCOUNTER — Other Ambulatory Visit: Payer: Self-pay

## 2018-12-09 ENCOUNTER — Encounter: Payer: Self-pay | Admitting: Radiation Oncology

## 2018-12-09 ENCOUNTER — Ambulatory Visit
Admission: RE | Admit: 2018-12-09 | Discharge: 2018-12-09 | Disposition: A | Discharge status: Home / Self Care | Payer: Medicare Other | Source: Ambulatory Visit | Attending: Radiation Oncology | Admitting: Radiation Oncology

## 2018-12-09 DIAGNOSIS — C50211 Malignant neoplasm of upper-inner quadrant of right female breast: Secondary | ICD-10-CM

## 2018-12-09 DIAGNOSIS — Z170 Estrogen receptor positive status [ER+]: Secondary | ICD-10-CM

## 2018-12-12 ENCOUNTER — Encounter: Payer: Self-pay | Admitting: Radiation Oncology

## 2018-12-13 ENCOUNTER — Ambulatory Visit
Admission: RE | Admit: 2018-12-13 | Discharge: 2018-12-13 | Disposition: A | Discharge status: Home / Self Care | Payer: Medicare Other | Source: Ambulatory Visit | Attending: Hematology and Oncology | Admitting: Hematology and Oncology

## 2018-12-13 DIAGNOSIS — Z170 Estrogen receptor positive status [ER+]: Secondary | ICD-10-CM

## 2018-12-13 DIAGNOSIS — C50211 Malignant neoplasm of upper-inner quadrant of right female breast: Secondary | ICD-10-CM

## 2018-12-13 MED ORDER — GADOBUTROL 1 MMOL/ML IV SOLN
6.0000 mL | Freq: Once | INTRAVENOUS | Status: AC | PRN
  Administered 2018-12-13: 6 mL via INTRAVENOUS

## 2018-12-19 NOTE — Progress Notes (Signed)
Patient Care Team: Bernerd Limbo, MD as PCP - General (Family Medicine)  DIAGNOSIS:    ICD-10-CM   1. Malignant neoplasm of upper-inner quadrant of right breast in female, estrogen receptor positive ([place])  C50.211    Z17.0     SUMMARY OF ONCOLOGIC HISTORY: Oncology History  Malignant neoplasm of upper-inner quadrant of right breast in female, estrogen receptor positive ([place])  11/15/2018 Initial Diagnosis   45-monthhistory of enlarging palpable lump in the right breast, mammogram revealed spiculated mass with architectural distortion.  Mass measures 2 cm but with the spiculations measures 4 cm, extensive involvement of overlying skin, left breast 8 mm calcifications, biopsy right breast mass and lymph node positive for invasive lobular cancer, lymph node also positive with extranodal extension, ER 95%, PR 100%, Ki-67 2%, HER-2 2+ by IHC but FISH negative ratio 1.44, left breast biopsy benign   11/22/2018 Cancer Staging   Staging form: Breast, AJCC 8th Edition - Clinical stage from 11/22/2018: Stage IIIB (cT4, cN1, cM0, G2, ER+, PR+, HER2-) - Signed by GNicholas Lose MD on 11/22/2018     CHIEF COMPLIANT: Follow-up of right breast cancer on anastrozole   INTERVAL HISTORY: Deborah STAHLYis a 85y.o. with above-mentioned history of right breast cancer currently on neoadjuvant anti-estrogen therapy with anastrozole. Breast MRI on 12/13/18 showed the known right breast malignancy measuring 4.8cm and right metastatic lymph node, an additional suspicious mass in the upper inner right breast measuring 1.4cm, and no malignancy in the left breast. She presents to the clinic today to review her MRI results.   REVIEW OF SYSTEMS:   Constitutional: Denies fevers, chills or abnormal weight loss Eyes: Denies blurriness of vision Ears, nose, mouth, throat, and face: Denies mucositis or sore throat Respiratory: Denies cough, dyspnea or wheezes Cardiovascular: Denies palpitation, chest  discomfort Gastrointestinal: Denies nausea, heartburn or change in bowel habits Skin: Denies abnormal skin rashes Lymphatics: Denies new lymphadenopathy or easy bruising Neurological: Denies numbness, tingling or new weaknesses Behavioral/Psych: Mood is stable, no new changes  Extremities: No lower extremity edema Breast: denies any pain or lumps or nodules in either breasts All other systems were reviewed with the patient and are negative.  I have reviewed the past medical history, past surgical history, social history and family history with the patient and they are unchanged from previous note.  ALLERGIES:  has No Known Allergies.  MEDICATIONS:  Current Outpatient Medications  Medication Sig Dispense Refill  . amLODipine (NORVASC) 2.5 MG tablet Take 1 tablet (2.5 mg total) by mouth daily. 90 tablet 3  . anastrozole (ARIMIDEX) 1 MG tablet Take 1 tablet (1 mg total) by mouth daily. 90 tablet 3  . benzonatate (TESSALON) 100 MG capsule Take 1 capsule (100 mg total) by mouth 2 (two) times daily. (Patient not taking: Reported on 12/09/2018) 20 capsule 0  . CALCIUM PO Take 1 tablet by mouth daily.    . Multiple Vitamins-Minerals (MULTIVITAMIN WITH MINERALS) tablet Take 1 tablet by mouth daily.    . NON FORMULARY as needed. [facility]  Onychomycosis Nail Lacquer -  Fluconazole 2%, Terbinafine 1% DMSO Apply to affected nail once daily Qty. 120 gm 3 refills     . simvastatin (ZOCOR) 20 MG tablet Take 1 tablet (20 mg total) by mouth daily. 90 tablet 3   No current facility-administered medications for this visit.     PHYSICAL EXAMINATION: ECOG PERFORMANCE STATUS: 3 - Symptomatic, >50% confined to bed I have not oriented  Vitals:   12/20/18  1346  BP: (!) 156/59  Pulse: 77  Resp: 18  Temp: 98 F (36.7 C)  SpO2: 97%   Filed Weights   12/20/18 1346  Weight: 134 lb 6.4 oz (61 kg)    GENERAL: alert, no distress and comfortable SKIN: skin color, texture, turgor are normal,  no rashes or significant lesions EYES: normal, Conjunctiva are pink and non-injected, sclera clear OROPHARYNX: no exudate, no erythema and lips, buccal mucosa, and tongue normal  NECK: supple, thyroid normal size, non-tender, without nodularity LYMPH: no palpable lymphadenopathy in the cervical, axillary or inguinal LUNGS: clear to auscultation and percussion with normal breathing effort HEART: regular rate & rhythm and no murmurs and no lower extremity edema ABDOMEN: abdomen soft, non-tender and normal bowel sounds MUSCULOSKELETAL: no cyanosis of digits and no clubbing  NEURO: alert & oriented x 3 with fluent speech, no focal motor/sensory deficits EXTREMITIES: No lower extremity edema  LABORATORY DATA:  I have reviewed the data as listed CMP Latest Ref Rng & Units 10/04/2017 07/21/2017 03/25/2017  Glucose 70 - 99 mg/dL 108(H) 111(H) 87  BUN 6 - 23 mg/dL _0 Creatinine 0.40 - 1.20 mg/dL 1.04 0.90 1.05  Sodium 135 - 145 mEq/L 143 142 145  Potassium 3.5 - 5.1 mEq/L 3.8 3.7 3.7  [place] 96 - 112 mEq/L 103 108 105  CO2 19 - 32 mEq/L 36(H) - 31  Calcium 8.4 - 10.5 mg/dL 10.2 - 9.6  Total Protein 6.0 - 8.3 g/dL 7.7 - 7.1  Total Bilirubin 0.2 - 1.2 mg/dL 0.4 - 0.6  Alkaline Phos 39 - 117 U/L 79 - 75  AST 0 - 37 U/L 23 - 22  ALT 0 - 35 U/L 13 - 16    Lab Results  Component Value Date   WBC 6.0 07/20/2017   HGB 13.6 07/21/2017   HCT 40.0 07/21/2017   MCV 92.7 07/20/2017   PLT 187.0 07/20/2017   NEUTROABS 2.9 07/20/2017    ASSESSMENT & PLAN:  Malignant neoplasm of upper-inner quadrant of right breast in female, estrogen receptor positive ([place]) 11/15/2018:45-monthhistory of enlarging palpable lump in the right breast, mammogram revealed spiculated mass with architectural distortion.  Mass measures 2 cm but with the spiculations measures 4 cm, extensive involvement of overlying skin, left breast 8 mm calcifications, biopsy right breast mass and lymph node positive for invasive  lobular cancer, lymph node also positive with extranodal extension, ER 95%, PR 100%, Ki-67 2%, HER-2 2+ by IHC but FISH negative ratio 1.44, left breast biopsy benign  T4 N1 stage IIIB  Recommendation: 1.  Neoadjuvant antiestrogen therapy with anastrozole 1 mg daily started 11/22/2018 2.  Follow-up breast conserving surgery with targeted node dissection 3.  Adjuvant radiation therapy 4.  Follow-up adjuvant antiestrogen therapy ------------------------------------------------------------------------------------------------------------------------------- 12/13/2018: Breast MRI: Biopsy-proven malignancy 4.8 x 4.8 cm with extension to the skin.  Additional 1.4 cm mass UIQ right breast.  Single right axillary lymph node.  Anastrozole toxicities: Denies any side effects to anastrozole Because she is having back pain issues, I recommended that we obtain CT chest abdomen pelvis and a bone scan for staging purposes. This may help with treatment planning if she has metastatic disease.  I will do a MyChart video visit few days after she undergoes the CT scans and bone scan.    No orders of the defined types were placed in this encounter.  The patient has a good understanding of the overall plan. she agrees with it. she will call with any problems  that may develop before the next visit here.  Nicholas Lose, MD 12/20/2018  Julious Oka Dorshimer, am acting as scribe for Dr. Nicholas Lose.  I have reviewed the above documentation for accuracy and completeness, and I agree with the above.

## 2018-12-20 ENCOUNTER — Other Ambulatory Visit: Payer: Self-pay

## 2018-12-20 ENCOUNTER — Other Ambulatory Visit: Payer: Self-pay | Admitting: *Deleted

## 2018-12-20 ENCOUNTER — Inpatient Hospital Stay: Payer: Medicare Other | Attending: Hematology and Oncology | Admitting: Hematology and Oncology

## 2018-12-20 DIAGNOSIS — Z170 Estrogen receptor positive status [ER+]: Secondary | ICD-10-CM

## 2018-12-20 DIAGNOSIS — Z79811 Long term (current) use of aromatase inhibitors: Secondary | ICD-10-CM | POA: Diagnosis not present

## 2018-12-20 DIAGNOSIS — Z923 Personal history of irradiation: Secondary | ICD-10-CM | POA: Diagnosis not present

## 2018-12-20 DIAGNOSIS — C50211 Malignant neoplasm of upper-inner quadrant of right female breast: Secondary | ICD-10-CM

## 2018-12-20 NOTE — Assessment & Plan Note (Signed)
11/15/2018:80-monthhistory of enlarging palpable lump in the right breast, mammogram revealed spiculated mass with architectural distortion.  Mass measures 2 cm but with the spiculations measures 4 cm, extensive involvement of overlying skin, left breast 8 mm calcifications, biopsy right breast mass and lymph node positive for invasive lobular cancer, lymph node also positive with extranodal extension, ER 95%, PR 100%, Ki-67 2%, HER-2 2+ by IHC but FISH negative ratio 1.44, left breast biopsy benign  T4 N1 stage IIIB  Recommendation: 1.  Neoadjuvant antiestrogen therapy with anastrozole 1 mg daily started 11/22/2018 2.  Follow-up breast conserving surgery with targeted node dissection 3.  Adjuvant radiation therapy 4.  Follow-up adjuvant antiestrogen therapy ------------------------------------------------------------------------------------------------------------------------------- 12/13/2018: Breast MRI: Biopsy-proven malignancy 4.8 x 4.8 cm with extension to the skin.  Additional 1.4 cm mass UIQ right breast.  Single right axillary lymph node.  Anastrozole toxicities:  Return to clinic in 3 months for follow-up

## 2018-12-21 ENCOUNTER — Telehealth: Payer: Self-pay | Admitting: Hematology and Oncology

## 2018-12-21 NOTE — Telephone Encounter (Signed)
I left a message regarding video visit  °

## 2018-12-26 ENCOUNTER — Other Ambulatory Visit: Payer: Self-pay | Admitting: *Deleted

## 2018-12-26 ENCOUNTER — Ambulatory Visit: Payer: Medicare Other | Admitting: Hematology and Oncology

## 2018-12-27 ENCOUNTER — Other Ambulatory Visit: Payer: Self-pay

## 2018-12-27 ENCOUNTER — Encounter (HOSPITAL_COMMUNITY)
Admission: RE | Admit: 2018-12-27 | Discharge: 2018-12-27 | Disposition: A | Discharge status: Home / Self Care | Payer: Medicare Other | Source: Ambulatory Visit | Attending: Hematology and Oncology | Admitting: Hematology and Oncology

## 2018-12-27 ENCOUNTER — Encounter: Payer: Self-pay | Admitting: *Deleted

## 2018-12-27 ENCOUNTER — Ambulatory Visit (HOSPITAL_COMMUNITY)
Admission: RE | Admit: 2018-12-27 | Discharge: 2018-12-27 | Disposition: A | Discharge status: Home / Self Care | Payer: Medicare Other | Source: Ambulatory Visit | Attending: Hematology and Oncology | Admitting: Hematology and Oncology

## 2018-12-27 DIAGNOSIS — Z170 Estrogen receptor positive status [ER+]: Secondary | ICD-10-CM | POA: Insufficient documentation

## 2018-12-27 DIAGNOSIS — C50211 Malignant neoplasm of upper-inner quadrant of right female breast: Secondary | ICD-10-CM | POA: Insufficient documentation

## 2018-12-27 MED ORDER — TECHNETIUM TC 99M MEDRONATE IV KIT
20.0000 | PACK | Freq: Once | INTRAVENOUS | Status: AC | PRN
  Administered 2018-12-27: 11:00:00 20.5 via INTRAVENOUS

## 2018-12-28 ENCOUNTER — Ambulatory Visit
Admission: RE | Admit: 2018-12-28 | Discharge: 2018-12-28 | Disposition: A | Discharge status: Home / Self Care | Payer: Medicare Other | Source: Ambulatory Visit | Attending: Hematology and Oncology | Admitting: Hematology and Oncology

## 2018-12-28 ENCOUNTER — Telehealth: Payer: Self-pay | Admitting: Hematology and Oncology

## 2018-12-28 DIAGNOSIS — Z170 Estrogen receptor positive status [ER+]: Secondary | ICD-10-CM

## 2018-12-28 DIAGNOSIS — C50211 Malignant neoplasm of upper-inner quadrant of right female breast: Secondary | ICD-10-CM

## 2018-12-28 MED ORDER — IOPAMIDOL (ISOVUE-300) INJECTION 61%
100.0000 mL | Freq: Once | INTRAVENOUS | Status: AC | PRN
  Administered 2018-12-28: 15:00:00 100 mL via INTRAVENOUS

## 2018-12-28 NOTE — Progress Notes (Signed)
HEMATOLOGY-ONCOLOGY MYCHART VIDEO VISIT PROGRESS NOTE  I connected with Deborah Bell on 12/29/2018 at  2:15 PM EST by MyChart video conference and verified that I am speaking with the correct person using two identifiers.  I discussed the limitations, risks, security and privacy concerns of performing an evaluation and management service by MyChart and the availability of in person appointments.  I also discussed with the patient that there may be a patient responsible charge related to this service. The patient expressed understanding and agreed to proceed.  Patient's Location: Home Physician Location: Clinic  CHIEF COMPLIANT: Follow-up of right breast cancer on anastrozole to review scans  INTERVAL HISTORY: Deborah Bell is a 85 y.o. female with above-mentioned history of right breast cancer currently on neoadjuvant anti-estrogen therapy with anastrozole. Bone scan on 12/26/28 showed degenerative changes in the lower thoracic spine and lumbar spine and no conclusive evidence of metastatic disease. CT CAP on 12/28/18 showed no evidence of metastatic disease. She presents over MyChart today to review her scans.  Her family report that the tumor in the breast has changed colors initially and was getting darker and then over the past few days that they felt that it was slightly increasing in size.  This raises some alarms about antiestrogen neoadjuvant therapy.  Oncology History  Malignant neoplasm of upper-inner quadrant of right breast in female, estrogen receptor positive ([place])  11/15/2018 Initial Diagnosis   33-monthhistory of enlarging palpable lump in the right breast, mammogram revealed spiculated mass with architectural distortion.  Mass measures 2 cm but with the spiculations measures 4 cm, extensive involvement of overlying skin, left breast 8 mm calcifications, biopsy right breast mass and lymph node positive for invasive lobular cancer, lymph node also positive with extranodal  extension, ER 95%, PR 100%, Ki-67 2%, HER-2 2+ by IHC but FISH negative ratio 1.44, left breast biopsy benign   11/22/2018 Cancer Staging   Staging form: Breast, AJCC 8th Edition - Clinical stage from 11/22/2018: Stage IIIB (cT4, cN1, cM0, G2, ER+, PR+, HER2-) - Signed by GNicholas Lose MD on 11/22/2018   11/22/2018 -  Neo-Adjuvant Anti-estrogen oral therapy   Anastrozole     REVIEW OF SYSTEMS:   Denies any other symptoms.  Observations/Objective:  There were no vitals filed for this visit. There is no height or weight on file to calculate BMI.  I have reviewed the data as listed CMP Latest Ref Rng & Units 10/04/2017 07/21/2017 03/25/2017  Glucose 70 - 99 mg/dL 108(H) 111(H) 87  BUN 6 - 23 mg/dL '19 23 19  ' Creatinine 0.40 - 1.20 mg/dL 1.04 0.90 1.05  Sodium 135 - 145 mEq/L 143 142 145  Potassium 3.5 - 5.1 mEq/L 3.8 3.7 3.7  [place] 96 - 112 mEq/L 103 108 105  CO2 19 - 32 mEq/L 36(H) - 31  Calcium 8.4 - 10.5 mg/dL 10.2 - 9.6  Total Protein 6.0 - 8.3 g/dL 7.7 - 7.1  Total Bilirubin 0.2 - 1.2 mg/dL 0.4 - 0.6  Alkaline Phos 39 - 117 U/L 79 - 75  AST 0 - 37 U/L 23 - 22  ALT 0 - 35 U/L 13 - 16    Lab Results  Component Value Date   WBC 6.0 07/20/2017   HGB 13.6 07/21/2017   HCT 40.0 07/21/2017   MCV 92.7 07/20/2017   PLT 187.0 07/20/2017   NEUTROABS 2.9 07/20/2017      Assessment Plan:  Malignant neoplasm of upper-inner quadrant of right breast in female, estrogen receptor  positive ([place]) 11/15/2018:96-monthhistory of enlarging palpable lump in the right breast, mammogram revealed spiculated mass with architectural distortion. Mass measures 2 cm but with the spiculations measures 4 cm, extensive involvement of overlying skin, left breast 8 mm calcifications, biopsy right breast mass and lymph node positive for invasive lobular cancer, lymph node also positive with extranodal extension, ER 95%, PR 100%, Ki-67 2%, HER-2 2+ by IHC but FISH negative ratio 1.44, left breast biopsy benign   T4N1 stage IIIB  Recommendation: 1.Neoadjuvant antiestrogen therapy with anastrozole 1 mg daily started 11/22/2018 2.Follow-up breast conserving surgery with targeted node dissection 3.Adjuvant radiation therapy 4.Follow-up adjuvant antiestrogen therapy ------------------------------------------------------------------------------------------------------------------------------- 12/13/2018: Breast MRI: Biopsy-proven malignancy 4.8 x 4.8 cm with extension to the skin.  Additional 1.4 cm mass UIQ right breast.  Single right axillary lymph node.  Anastrozole toxicities: Denies any side effects to anastrozole  12/27/2018: Bone scan: Mild uptake in the lower thoracic spine and moderate uptake in the lumbar spine is nonspecific and could be degenerative changes 12/28/2018: CT CAP: No acute findings in chest abdomen pelvis.  Atherosclerosis, large ventral hernia  Radiology counseling: I discussed the CT scans and bone scan results with the patient and her family.  There is no evidence of metastatic disease.  So we will continue the current treatment plan with anastrozole.  Increasing size of the breast lump: Patient's daughter has noticed that increase in size of the lump.  Because of this we will continue her antiestrogen therapy for another month and then obtain a mammogram and ultrasound and follow-up with me.  Follow-up in 1 months with mammogram and ultrasound.    I discussed the assessment and treatment plan with the patient. The patient was provided an opportunity to ask questions and all were answered. The patient agreed with the plan and demonstrated an understanding of the instructions. The patient was advised to call back or seek an in-person evaluation if the symptoms worsen or if the condition fails to improve as anticipated.   I provided 15 minutes of face-to-face MyChart video visit time during this encounter.    VRulon Eisenmenger MD 12/29/2018   I, Molly Dorshimer,  am acting as scribe for VNicholas Lose MD.  I have reviewed the above documentation for accuracy and completeness, and I agree with the above.

## 2018-12-28 NOTE — Telephone Encounter (Signed)
Left VM for pt to confirm virtual appt for 12/10

## 2018-12-29 ENCOUNTER — Telehealth: Payer: Self-pay | Admitting: Hematology and Oncology

## 2018-12-29 ENCOUNTER — Inpatient Hospital Stay (HOSPITAL_BASED_OUTPATIENT_CLINIC_OR_DEPARTMENT_OTHER): Payer: Medicare Other | Admitting: Hematology and Oncology

## 2018-12-29 DIAGNOSIS — Z170 Estrogen receptor positive status [ER+]: Secondary | ICD-10-CM | POA: Diagnosis not present

## 2018-12-29 DIAGNOSIS — C50211 Malignant neoplasm of upper-inner quadrant of right female breast: Secondary | ICD-10-CM

## 2018-12-29 NOTE — Telephone Encounter (Signed)
I left a message regarding schedule  

## 2018-12-29 NOTE — Assessment & Plan Note (Signed)
11/15/2018:71-monthhistory of enlarging palpable lump in the right breast, mammogram revealed spiculated mass with architectural distortion. Mass measures 2 cm but with the spiculations measures 4 cm, extensive involvement of overlying skin, left breast 8 mm calcifications, biopsy right breast mass and lymph node positive for invasive lobular cancer, lymph node also positive with extranodal extension, ER 95%, PR 100%, Ki-67 2%, HER-2 2+ by IHC but FISH negative ratio 1.44, left breast biopsy benign  T4N1 stage IIIB  Recommendation: 1.Neoadjuvant antiestrogen therapy with anastrozole 1 mg daily started 11/22/2018 2.Follow-up breast conserving surgery with targeted node dissection 3.Adjuvant radiation therapy 4.Follow-up adjuvant antiestrogen therapy ------------------------------------------------------------------------------------------------------------------------------- 12/13/2018: Breast MRI: Biopsy-proven malignancy 4.8 x 4.8 cm with extension to the skin.  Additional 1.4 cm mass UIQ right breast.  Single right axillary lymph node.  Anastrozole toxicities: Denies any side effects to anastrozole  12/27/2018: Bone scan: Mild uptake in the lower thoracic spine and moderate uptake in the lumbar spine is nonspecific and could be degenerative changes 12/28/2018: CT CAP: No acute findings in chest abdomen pelvis.  Atherosclerosis, large ventral hernia  Radiology counseling: I discussed the CT scans and bone scan results with the patient and her family.  There is no evidence of metastatic disease.  So we will continue the current treatment plan with anastrozole. Follow-up in 3 months with mammogram and ultrasound.

## 2019-01-17 ENCOUNTER — Telehealth: Payer: Self-pay | Admitting: Hematology and Oncology

## 2019-01-17 NOTE — Telephone Encounter (Signed)
Rescheduled per MD. Hulen Skains and spoke with daughter, confirmed new appt 1/18

## 2019-01-18 ENCOUNTER — Ambulatory Visit
Admission: RE | Admit: 2019-01-18 | Discharge: 2019-01-18 | Disposition: A | Discharge status: Home / Self Care | Payer: Medicare Other | Source: Ambulatory Visit | Attending: Physician Assistant | Admitting: Physician Assistant

## 2019-01-18 ENCOUNTER — Other Ambulatory Visit: Payer: Self-pay

## 2019-01-18 DIAGNOSIS — Z780 Asymptomatic menopausal state: Secondary | ICD-10-CM

## 2019-01-18 DIAGNOSIS — R2681 Unsteadiness on feet: Secondary | ICD-10-CM

## 2019-01-19 ENCOUNTER — Ambulatory Visit (INDEPENDENT_AMBULATORY_CARE_PROVIDER_SITE_OTHER): Payer: Medicare Other | Admitting: Podiatry

## 2019-01-19 DIAGNOSIS — L600 Ingrowing nail: Secondary | ICD-10-CM

## 2019-01-19 DIAGNOSIS — M79675 Pain in left toe(s): Secondary | ICD-10-CM

## 2019-01-19 NOTE — Progress Notes (Signed)
Subjective: 85 year old female presents the office today with her daughter for concerns of ingrown toenail, pain to left big toe.  She states the area is tender with pressure but denies any redness or drainage or any swelling.  She previously was having pain in the right big toe but no pain currently.  No drainage the other toenails.  No other concerns. Denies any systemic complaints such as fevers, chills, nausea, vomiting. No acute changes since last appointment, and no other complaints at this time.   Objective: AAO x3, NAD DP/PT pulses palpable bilaterally, CRT less than 3 seconds Left hallux toenail is incurvated on both medial lateral aspects there is tenderness palpation on the nail borders.  There is no edema, erythema, drainage or pus or any signs of infection.  The pain is the distal aspect the toenail but no pain on the proximal nail folds. No open lesions or pre-ulcerative lesions.  No pain with calf compression, swelling, warmth, erythema  Assessment: Left hallux ingrown toenail without infection  Plan: -All treatment options discussed with the patient including all alternatives, risks, complications.  -Discussed nail removal but they wish to hold off.  I sharply debrided the left hallux toenail any complications or bleeding.  Recommended antibiotic ointment dressing changes daily.  Monitor for any signs or symptoms of infection. -Patient encouraged to call the office with any questions, concerns, change in symptoms.    Trula Slade DPM

## 2019-01-19 NOTE — Patient Instructions (Signed)
Apply a small amount antibiotic ointment and bandage daily. If you notice any increase in redness, drainage, swelling, pain or any signs of infection please call me at [phone]. Happy New Year!!

## 2019-01-26 ENCOUNTER — Ambulatory Visit: Payer: Medicare Other | Admitting: Hematology and Oncology

## 2019-02-06 ENCOUNTER — Ambulatory Visit: Payer: Medicare Other | Admitting: Hematology and Oncology

## 2019-02-06 NOTE — Assessment & Plan Note (Deleted)
11/15/2018:24-monthhistory of enlarging palpable lump in the right breast, mammogram revealed spiculated mass with architectural distortion. Mass measures 2 cm but with the spiculations measures 4 cm, extensive involvement of overlying skin, left breast 8 mm calcifications, biopsy right breast mass and lymph node positive for invasive lobular cancer, lymph node also positive with extranodal extension, ER 95%, PR 100%, Ki-67 2%, HER-2 2+ by IHC but FISH negative ratio 1.44, left breast biopsy benign  T4N1 stage IIIB  Recommendation: 1.Neoadjuvant antiestrogen therapywith anastrozole 1 mg daily started 11/22/2018 2.Follow-up breast conserving surgery with targeted node dissection 3.Adjuvant radiation therapy 4.Follow-up adjuvant antiestrogen therapy ------------------------------------------------------------------------------------------------------------------------------- 12/13/2018: Breast MRI: Biopsy-proven malignancy 4.8 x 4.8 cm with extension to the skin. Additional 1.4 cm mass UIQ right breast. Single right axillary lymph node.  Anastrozole toxicities:Denies any side effects to anastrozole 12/27/2018: Bone scan: Mild uptake in the lower thoracic spine and moderate uptake in the lumbar spine is nonspecific and could be degenerative changes 12/28/2018: CT CAP: No acute findings in chest abdomen pelvis.  Atherosclerosis, large ventral hernia  Mammograms ultrasound scheduled for 02/13/2019

## 2019-02-13 ENCOUNTER — Other Ambulatory Visit: Payer: Self-pay

## 2019-02-13 ENCOUNTER — Ambulatory Visit
Admission: RE | Admit: 2019-02-13 | Discharge: 2019-02-13 | Disposition: A | Discharge status: Home / Self Care | Payer: Medicare Other | Source: Ambulatory Visit | Attending: Hematology and Oncology | Admitting: Hematology and Oncology

## 2019-02-13 DIAGNOSIS — C50211 Malignant neoplasm of upper-inner quadrant of right female breast: Secondary | ICD-10-CM

## 2019-02-13 DIAGNOSIS — Z170 Estrogen receptor positive status [ER+]: Secondary | ICD-10-CM

## 2019-02-21 NOTE — Progress Notes (Signed)
Patient Care Team: Bernerd Limbo, MD as PCP - General (Family Medicine)  DIAGNOSIS:    ICD-10-CM   1. Malignant neoplasm of upper-inner quadrant of right breast in female, estrogen receptor positive ([place])  C50.211    Z17.0     SUMMARY OF ONCOLOGIC HISTORY: Oncology History  Malignant neoplasm of upper-inner quadrant of right breast in female, estrogen receptor positive ([place])  11/15/2018 Initial Diagnosis   51-monthhistory of enlarging palpable lump in the right breast, mammogram revealed spiculated mass with architectural distortion.  Mass measures 2 cm but with the spiculations measures 4 cm, extensive involvement of overlying skin, left breast 8 mm calcifications, biopsy right breast mass and lymph node positive for invasive lobular cancer, lymph node also positive with extranodal extension, ER 95%, PR 100%, Ki-67 2%, HER-2 2+ by IHC but FISH negative ratio 1.44, left breast biopsy benign   11/22/2018 Cancer Staging   Staging form: Breast, AJCC 8th Edition - Clinical stage from 11/22/2018: Stage IIIB (cT4, cN1, cM0, G2, ER+, PR+, HER2-) - Signed by GNicholas Lose MD on 11/22/2018   11/22/2018 -  Neo-Adjuvant Anti-estrogen oral therapy   Anastrozole     CHIEF COMPLIANT: Follow-up of right breast cancer on anastrozole   INTERVAL HISTORY: Deborah Bell an 86y.o. with above-mentioned history of right breast cancer currently on neoadjuvant anti-estrogen therapy with anastrozole. Mammogram on 02/13/19 showed decrease in the size of the right breast mass at the 12:30 position and slight increase in the size of the right axillary lymph node. She presents to the clinic today for follow-up.    She is doing extremely well from antiestrogen therapy standpoint.  She does have itching of the breast.  ALLERGIES:  has No Known Allergies.  MEDICATIONS:  Current Outpatient Medications  Medication Sig Dispense Refill  . amLODipine (NORVASC) 2.5 MG tablet Take 1 tablet (2.5 mg total) by mouth  daily. 90 tablet 3  . anastrozole (ARIMIDEX) 1 MG tablet Take 1 tablet (1 mg total) by mouth daily. 90 tablet 3  . CALCIUM PO Take 1 tablet by mouth daily.    . Multiple Vitamins-Minerals (MULTIVITAMIN WITH MINERALS) tablet Take 1 tablet by mouth daily.    . simvastatin (ZOCOR) 20 MG tablet Take 1 tablet (20 mg total) by mouth daily. 90 tablet 3   No current facility-administered medications for this visit.    PHYSICAL EXAMINATION: ECOG PERFORMANCE STATUS: 1 - Symptomatic but completely ambulatory    LABORATORY DATA:  I have reviewed the data as listed CMP Latest Ref Rng & Units 10/04/2017 07/21/2017 03/25/2017  Glucose 70 - 99 mg/dL 108(H) 111(H) 87  BUN 6 - 23 mg/dL '19 23 19  ' Creatinine 0.40 - 1.20 mg/dL 1.04 0.90 1.05  Sodium 135 - 145 mEq/L 143 142 145  Potassium 3.5 - 5.1 mEq/L 3.8 3.7 3.7  Chloride 96 - 112 mEq/L 103 108 105  CO2 19 - 32 mEq/L 36(H) - 31  Calcium 8.4 - 10.5 mg/dL 10.2 - 9.6  Total Protein 6.0 - 8.3 g/dL 7.7 - 7.1  Total Bilirubin 0.2 - 1.2 mg/dL 0.4 - 0.6  Alkaline Phos 39 - 117 U/L 79 - 75  AST 0 - 37 U/L 23 - 22  ALT 0 - 35 U/L 13 - 16    Lab Results  Component Value Date   WBC 6.0 07/20/2017   HGB 13.6 07/21/2017   HCT 40.0 07/21/2017   MCV 92.7 07/20/2017   PLT 187.0 07/20/2017   NEUTROABS 2.9 07/20/2017  ASSESSMENT & PLAN:  Malignant neoplasm of upper-inner quadrant of right breast in female, estrogen receptor positive ([place]) 11/15/2018:84-monthhistory of enlarging palpable lump in the right breast, mammogram revealed spiculated mass with architectural distortion. Mass measures 2 cm but with the spiculations measures 4 cm, extensive involvement of overlying skin, left breast 8 mm calcifications, biopsy right breast mass and lymph node positive for invasive lobular cancer, lymph node also positive with extranodal extension, ER 95%, PR 100%, Ki-67 2%, HER-2 2+ by IHC but FISH negative ratio 1.44, left breast biopsy benign  T4N1 stage  IIIB  Recommendation: 1.Neoadjuvant antiestrogen therapywith anastrozole 1 mg daily started 11/22/2018 2.Follow-up breast conserving surgery with targeted node dissection 3.Adjuvant radiation therapy 4.Follow-up adjuvant antiestrogen therapy ------------------------------------------------------------------------------------------------------------------------------- 12/13/2018: Breast MRI: Biopsy-proven malignancy 4.8 x 4.8 cm with extension to the skin. Additional 1.4 cm mass UIQ right breast. Single right axillary lymph node.  Anastrozole toxicities:Denies any side effects to anastrozole  12/27/2018: Bone scan: Mild uptake in the lower thoracic spine and moderate uptake in the lumbar spine is nonspecific and could be degenerative changes 12/28/2018: CT CAP: No acute findings in chest abdomen pelvis.  Atherosclerosis, large ventral hernia  Mammogram and ultrasound 02/20/2019: The spiculated right breast mass measures 1.8 cm (previously was 2.6 cm) overlying skin thickening also reduced.  Right axillary lymph node 10 mm (previously was 8 mm)  Treatment plan: Continue with neoadjuvant antiestrogen therapy for 3 more months before contemplating on surgery. Will order breast MRI in 3 months and follow-up after that with another telephone visit    No orders of the defined types were placed in this encounter.  The patient has a good understanding of the overall plan. she agrees with it. she will call with any problems that may develop before the next visit here.  Total time spent: 30 mins including face to face time and time spent for planning, charting and coordination of care  GNicholas Lose MD 02/22/2019  I, MCloyde ReamsDorshimer, am acting as scribe for Dr. VNicholas Lose  I have reviewed the above documentation for accuracy and completeness, and I agree with the above.

## 2019-02-22 ENCOUNTER — Other Ambulatory Visit: Payer: Self-pay

## 2019-02-22 ENCOUNTER — Inpatient Hospital Stay: Payer: Medicare Other | Attending: Hematology and Oncology | Admitting: Hematology and Oncology

## 2019-02-22 DIAGNOSIS — C50211 Malignant neoplasm of upper-inner quadrant of right female breast: Secondary | ICD-10-CM | POA: Diagnosis not present

## 2019-02-22 DIAGNOSIS — Z170 Estrogen receptor positive status [ER+]: Secondary | ICD-10-CM

## 2019-02-22 NOTE — Assessment & Plan Note (Signed)
11/15/2018:69-monthhistory of enlarging palpable lump in the right breast, mammogram revealed spiculated mass with architectural distortion. Mass measures 2 cm but with the spiculations measures 4 cm, extensive involvement of overlying skin, left breast 8 mm calcifications, biopsy right breast mass and lymph node positive for invasive lobular cancer, lymph node also positive with extranodal extension, ER 95%, PR 100%, Ki-67 2%, HER-2 2+ by IHC but FISH negative ratio 1.44, left breast biopsy benign  T4N1 stage IIIB  Recommendation: 1.Neoadjuvant antiestrogen therapywith anastrozole 1 mg daily started 11/22/2018 2.Follow-up breast conserving surgery with targeted node dissection 3.Adjuvant radiation therapy 4.Follow-up adjuvant antiestrogen therapy ------------------------------------------------------------------------------------------------------------------------------- 12/13/2018: Breast MRI: Biopsy-proven malignancy 4.8 x 4.8 cm with extension to the skin. Additional 1.4 cm mass UIQ right breast. Single right axillary lymph node.  Anastrozole toxicities:Denies any side effects to anastrozole  12/27/2018: Bone scan: Mild uptake in the lower thoracic spine and moderate uptake in the lumbar spine is nonspecific and could be degenerative changes 12/28/2018: CT CAP: No acute findings in chest abdomen pelvis.  Atherosclerosis, large ventral hernia  Mammogram and ultrasound 02/20/2019: The spiculated right breast mass measures 1.8 cm (previously was 2.6 cm) overlying skin thickening also reduced.  Right axillary lymph node 10 mm (previously was 8 mm)  Treatment plan: Continue with neoadjuvant antiestrogen therapy for 3 more months before contemplating on surgery.

## 2019-02-23 ENCOUNTER — Telehealth: Payer: Self-pay | Admitting: Hematology and Oncology

## 2019-02-23 NOTE — Telephone Encounter (Signed)
I left a message regarding schedule  

## 2019-02-26 ENCOUNTER — Encounter (HOSPITAL_COMMUNITY): Payer: Self-pay | Admitting: Emergency Medicine

## 2019-02-26 ENCOUNTER — Other Ambulatory Visit: Payer: Self-pay

## 2019-02-26 ENCOUNTER — Ambulatory Visit (HOSPITAL_COMMUNITY)
Admission: EM | Admit: 2019-02-26 | Discharge: 2019-02-26 | Disposition: A | Discharge status: Home / Self Care | Payer: Medicare Other | Attending: Family Medicine | Admitting: Family Medicine

## 2019-02-26 DIAGNOSIS — M5431 Sciatica, right side: Secondary | ICD-10-CM

## 2019-02-26 MED ORDER — METHYLPREDNISOLONE SODIUM SUCC 125 MG IJ SOLR
INTRAMUSCULAR | Status: AC
  Filled 2019-02-26: qty 2

## 2019-02-26 MED ORDER — METHYLPREDNISOLONE SODIUM SUCC 125 MG IJ SOLR: 125.0000 mg | Freq: Once | INTRAMUSCULAR | Status: DC

## 2019-02-26 MED ORDER — TIZANIDINE HCL 4 MG PO TABS: 4.0000 mg | ORAL_TABLET | Freq: Every day | ORAL | 0 refills | Status: AC

## 2019-02-26 MED ORDER — METHYLPREDNISOLONE SODIUM SUCC 125 MG IJ SOLR
80.0000 mg | Freq: Once | INTRAMUSCULAR | Status: AC
  Administered 2019-02-26: 16:00:00 80 mg via INTRAMUSCULAR

## 2019-02-26 NOTE — ED Provider Notes (Signed)
[facility]    CSN: [id number] Arrival date & time: 02/26/19  1419      History   Chief Complaint Chief Complaint  Patient presents with  . Hip Pain    HPI DEANNAH LUMPKINS is a 86 y.o. female.  Daughter/Caregiver providing history  HPI  Patient with a history of chronic lumbar sciatica pain presents today with 1 day of 10/10 right-sided right hip sciatic pain.  Patient is followed by Dr. Vonzella Nipple at [facility] for management of chronic sciatic pain.  She had her last Solu-Medrol injection approximately 4 months ago and is overdue for her injection.  She receives approximately 3 to 4 injections/year for this problem.  She is not managed on anything by mouth for sciatica.  She has been applying over-the-counter pain patches and applying heat without any relief.  The pain is so severe that she is unable to rest appropriately at night. Past Medical History:  Diagnosis Date  . Arthritis   . Diverticulitis   . History of fainting spells of unknown cause   . Hypercholesteremia   . Hypertension   . Pulmonary emboli [facility])     Patient Active Problem List   Diagnosis Date Noted  . Malignant neoplasm of upper-inner quadrant of right breast in female, estrogen receptor positive ([place]) 11/22/2018  . CKD (chronic kidney disease) stage 3, GFR 30-59 ml/min 04/05/2017  . Glaucoma 04/05/2017  . Obesity, Class I, BMI 30-34.9 05/08/2016  . Incisional hernia of anterior abdominal wall without obstruction or gangrene 05/08/2016  . Hyperlipidemia 05/07/2016  . Chronic pain disorder 10/29/2015  . Essential hypertension 10/29/2015  . Unstable gait 10/29/2015  . Osteoarthritis of left knee 10/29/2015  . Vertigo 10/29/2015  . Low back pain with radiation, unspecified laterality 10/29/2015    Past Surgical History:  Procedure Laterality Date  . APPENDECTOMY    . BREAST BIOPSY Right 11/15/2018   Malignant  . BREAST BIOPSY Right 11/15/2018   Malignant LN  . BREAST  BIOPSY Left 11/15/2018   Benign  . CARPAL TUNNEL RELEASE    . HERNIA REPAIR  2011   mesh removed  . TONSILLECTOMY      OB History   No obstetric history on file.      Home Medications    Prior to Admission medications   Medication Sig Start Date End Date Taking? Authorizing Provider  amLODipine (NORVASC) 2.5 MG tablet Take 1 tablet (2.5 mg total) by mouth daily. 04/05/17   Martinique, Betty G, MD  anastrozole (ARIMIDEX) 1 MG tablet Take 1 tablet (1 mg total) by mouth daily. 11/22/18   Nicholas Lose, MD  CALCIUM PO Take 1 tablet by mouth daily.    [provider]  Multiple Vitamins-Minerals (MULTIVITAMIN WITH MINERALS) tablet Take 1 tablet by mouth daily.    [provider]  simvastatin (ZOCOR) 20 MG tablet Take 1 tablet (20 mg total) by mouth daily. 04/05/17   Martinique, Betty G, MD    Family History Family History  Problem Relation Age of Onset  . Diabetes Mother   . Diabetes Father     Social History Social History   Tobacco Use  . Smoking status: Never Smoker  . Smokeless tobacco: Never Used  Substance Use Topics  . Alcohol use: No  . Drug use: No     Allergies   Patient has no known allergies.   Review of Systems Review of Systems Pertinent negatives listed in HPI Physical Exam Triage Vital Signs ED Triage Vitals  Enc Vitals Group     BP 02/26/19 1529 (!) 173/76     Pulse Rate 02/26/19 1529 72     Resp 02/26/19 1529 20     Temp 02/26/19 1529 98.4 F (36.9 C)     Temp src --      SpO2 02/26/19 1529 96 %     Weight --      Height --      Head Circumference --      Peak Flow --      Pain Score 02/26/19 1530 10     Pain Loc --      Pain Edu? --      Excl. in [place]? --    No data found.  Updated Vital Signs BP (!) 173/76   Pulse 72   Temp 98.4 F (36.9 C)   Resp 20   SpO2 96%   Visual Acuity Right Eye Distance:   Left Eye Distance:   Bilateral Distance:    Right Eye Near:   Left Eye Near:    Bilateral Near:     Physical  Exam Constitutional:      General: She is in acute distress.  Cardiovascular:     Rate and Rhythm: Normal rate.  Pulmonary:     Effort: Pulmonary effort is normal.  Musculoskeletal:     Cervical back: Normal range of motion.     Right hip: Tenderness present. Decreased range of motion. Decreased strength.  Skin:    General: Skin is warm.  Neurological:     Mental Status: She is oriented to person, place, and time.  Psychiatric:        Mood and Affect: Affect is tearful.     UC Treatments / Results  Labs (all labs ordered are listed, but only abnormal results are displayed) Labs Reviewed - No data to display  EKG   Radiology No results found.  Procedures Procedures (including critical care time)  Medications Ordered in UC Medications - No data to display  Initial Impression / Assessment and Plan / UC Course  I have reviewed the triage vital signs and the nursing notes.  Pertinent labs & imaging results that were available during my care of the patient were reviewed by me and considered in my medical decision making (see chart for details).    Sciatica of right side methylprednisolone 80 mg IM ordered. Patient offered a short course of pain medication, however declined. Daughter/Caregiver of patient agreed to a prescription for a muscle relaxer. Cautioned provided regarding effects of the medication. Follow-up with Dr. Ernestina Patches regarding further management of sciatica related pain. Final Clinical Impressions(s) / UC Diagnoses   Final diagnoses:  Sciatica of right side   Discharge Instructions   None    ED Prescriptions    Medication Sig Dispense Auth. Provider   tiZANidine (ZANAFLEX) 4 MG tablet Take 1 tablet (4 mg total) by mouth at bedtime. 20 tablet Scot Jun, FNP     PDMP not reviewed this encounter.   Scot Jun, [place] 02/28/19 [phone]

## 2019-02-26 NOTE — ED Triage Notes (Signed)
Per family, pt has hx of sciatic nerve pain, is due for a steroid shot but has been unable to get in to see doctor. Pt started having severe sharp pain in her R hip this morning.

## 2019-02-27 ENCOUNTER — Telehealth: Payer: Self-pay | Admitting: Radiology

## 2019-02-27 ENCOUNTER — Other Ambulatory Visit: Payer: Self-pay | Admitting: Physical Medicine and Rehabilitation

## 2019-02-27 DIAGNOSIS — M5416 Radiculopathy, lumbar region: Secondary | ICD-10-CM

## 2019-02-27 MED ORDER — ACETAMINOPHEN-CODEINE #3 300-30 MG PO TABS: 1.0000 | ORAL_TABLET | Freq: Three times a day (TID) | ORAL | 0 refills | Status: AC | PRN

## 2019-02-27 NOTE — Telephone Encounter (Signed)
Patient is scheduled for 2/16 for an ESI. Her daughter states that the patient was prescribed tizanidine at Urgent Care and she is asking if there is anything stronger that you could recommend since she has to wait so long for the injection. Please advise.

## 2019-02-27 NOTE — Telephone Encounter (Signed)
Called patient's daughter to advise. 

## 2019-02-27 NOTE — Telephone Encounter (Signed)
Ok

## 2019-02-27 NOTE — Telephone Encounter (Signed)
Left L4 TF 10/24/2018. Ok to repeat?

## 2019-02-27 NOTE — Telephone Encounter (Signed)
Tylenol #3 sent to pharm on record

## 2019-02-27 NOTE — Progress Notes (Signed)
Tylenol #3 for pain until injection. Patient's 12 month [place] history was reviewed and no inappropriate medication refills noted.

## 2019-02-27 NOTE — Telephone Encounter (Signed)
Patient's daughter, Holland Commons, called this morning and states that her mom had to go to the Urgent Care this weekend due to extreme sciatic pain. Patient normally sees Dr. Ernestina Patches for injections. Millie requests return call to set up appointment. Please call [phone]

## 2019-03-07 ENCOUNTER — Ambulatory Visit (INDEPENDENT_AMBULATORY_CARE_PROVIDER_SITE_OTHER): Payer: Medicare Other | Admitting: Physical Medicine and Rehabilitation

## 2019-03-07 ENCOUNTER — Other Ambulatory Visit: Payer: Self-pay

## 2019-03-07 ENCOUNTER — Ambulatory Visit: Payer: Self-pay

## 2019-03-07 VITALS — BP 143/85 | HR 77

## 2019-03-07 DIAGNOSIS — M48062 Spinal stenosis, lumbar region with neurogenic claudication: Secondary | ICD-10-CM | POA: Diagnosis not present

## 2019-03-07 DIAGNOSIS — M5416 Radiculopathy, lumbar region: Secondary | ICD-10-CM | POA: Diagnosis not present

## 2019-03-07 DIAGNOSIS — M419 Scoliosis, unspecified: Secondary | ICD-10-CM

## 2019-03-07 MED ORDER — METHYLPREDNISOLONE ACETATE 80 MG/ML IJ SUSP
40.0000 mg | Freq: Once | INTRAMUSCULAR | Status: AC
  Administered 2019-03-07: 40 mg

## 2019-03-07 NOTE — Progress Notes (Signed)
   Numeric Pain Rating Scale and Functional Assessment Average Pain 10   In the last MONTH (on 0-10 scale) has pain interfered with the following?  1. General activity like being  able to carry out your everyday physical activities such as walking, climbing stairs, carrying groceries, or moving a chair?  Rating(8)   +Driver, -BT, -Dye Allergies.  

## 2019-03-15 ENCOUNTER — Other Ambulatory Visit: Payer: Self-pay | Admitting: Family Medicine

## 2019-03-20 ENCOUNTER — Other Ambulatory Visit: Payer: Medicare Other

## 2019-04-02 ENCOUNTER — Other Ambulatory Visit: Payer: Self-pay

## 2019-04-02 ENCOUNTER — Ambulatory Visit
Admission: RE | Admit: 2019-04-02 | Discharge: 2019-04-02 | Disposition: A | Discharge status: Home / Self Care | Payer: Medicare Other | Source: Ambulatory Visit | Attending: Hematology and Oncology | Admitting: Hematology and Oncology

## 2019-04-02 DIAGNOSIS — C50211 Malignant neoplasm of upper-inner quadrant of right female breast: Secondary | ICD-10-CM

## 2019-04-02 DIAGNOSIS — Z170 Estrogen receptor positive status [ER+]: Secondary | ICD-10-CM

## 2019-04-02 MED ORDER — GADOBUTROL 1 MMOL/ML IV SOLN
6.0000 mL | Freq: Once | INTRAVENOUS | Status: AC | PRN
  Administered 2019-04-02: 6 mL via INTRAVENOUS

## 2019-05-08 NOTE — Procedures (Signed)
Lumbar Epidural Steroid Injection - Interlaminar Approach with Fluoroscopic Guidance  Patient: Deborah Bell      Date of Birth: 1928/01/27 MRN: [id number] PCP: Bernerd Limbo, MD      Visit Date: 03/07/2019   Universal Protocol:     Consent Given By: the patient  Position: PRONE  Additional Comments: Vital signs were monitored before and after the procedure. Patient was prepped and draped in the usual sterile fashion. The correct patient, procedure, and site was verified.   Injection Procedure Details:  Procedure Site One Meds Administered:  Meds ordered this encounter  Medications  . methylPREDNISolone acetate (DEPO-MEDROL) injection 40 mg     Laterality: Right  Location/Site:  L4-L5  Needle size: 20 G  Needle type: Tuohy  Needle Placement: Paramedian epidural  Findings:   -Comments: Excellent flow of contrast into the epidural space.  Procedure Details: Using a paramedian approach from the side mentioned above, the region overlying the inferior lamina was localized under fluoroscopic visualization and the soft tissues overlying this structure were infiltrated with 4 ml. of 1% Lidocaine without Epinephrine. The Tuohy needle was inserted into the epidural space using a paramedian approach.   The epidural space was localized using loss of resistance along with lateral and bi-planar fluoroscopic views.  After negative aspirate for air, blood, and CSF, a 2 ml. volume of Isovue-250 was injected into the epidural space and the flow of contrast was observed. Radiographs were obtained for documentation purposes.    The injectate was administered into the level noted above.   Additional Comments:  The patient tolerated the procedure well Dressing: 2 x 2 sterile gauze and Band-Aid    Post-procedure details: Patient was observed during the procedure. Post-procedure instructions were reviewed.  Patient left the clinic in stable condition.

## 2019-05-08 NOTE — Progress Notes (Signed)
Deborah Bell - 86 y.o. female MRN [id number]  Date of birth: [birth year]  Office Visit Note: Visit Date: 03/07/2019 PCP: Bernerd Limbo, MD Referred by: Bernerd Limbo, MD  Subjective: Chief Complaint  Patient presents with  . Lower Back - Pain   HPI:  Deborah Bell is a 86 y.o. female who comes in today For what we thought was going to be a repeat L4 transforaminal injection on the left which historically she has had more left-sided complaints.  She comes in today with her daughter who provides a lot of the history.  She is having right hip and leg pain.  The right side actually seems to be more consistent with her pathology which is significant scoliosis at L3-4 from collapse through the arthritis at that level.  She does have pretty significant stenosis at L3-4.  She has no red flag complaints other than worsening symptoms over the last several months.  We will complete right L4-5 interlaminar epidural steroid injection.  ROS Otherwise per HPI.  Assessment & Plan: Visit Diagnoses:  1. Radiculopathy, lumbar region   2. Spinal stenosis of lumbar region with neurogenic claudication   3. Scoliosis of lumbar spine, unspecified scoliosis type     Plan: No additional findings.   Meds & Orders:  Meds ordered this encounter  Medications  . methylPREDNISolone acetate (DEPO-MEDROL) injection 40 mg    Orders Placed This Encounter  Procedures  . XR C-ARM NO REPORT  . Epidural Steroid injection    Follow-up: Return if symptoms worsen or fail to improve.   Procedures: No procedures performed  Lumbar Epidural Steroid Injection - Interlaminar Approach with Fluoroscopic Guidance  Patient: Deborah Bell      Date of Birth: 16-Jul-1928 MRN: [id number] PCP: Bernerd Limbo, MD      Visit Date: 03/07/2019   Universal Protocol:     Consent Given By: the patient  Position: PRONE  Additional Comments: Vital signs were monitored before and after the procedure. Patient was  prepped and draped in the usual sterile fashion. The correct patient, procedure, and site was verified.   Injection Procedure Details:  Procedure Site One Meds Administered:  Meds ordered this encounter  Medications  . methylPREDNISolone acetate (DEPO-MEDROL) injection 40 mg     Laterality: Right  Location/Site:  L4-L5  Needle size: 20 G  Needle type: Tuohy  Needle Placement: Paramedian epidural  Findings:   -Comments: Excellent flow of contrast into the epidural space.  Procedure Details: Using a paramedian approach from the side mentioned above, the region overlying the inferior lamina was localized under fluoroscopic visualization and the soft tissues overlying this structure were infiltrated with 4 ml. of 1% Lidocaine without Epinephrine. The Tuohy needle was inserted into the epidural space using a paramedian approach.   The epidural space was localized using loss of resistance along with lateral and bi-planar fluoroscopic views.  After negative aspirate for air, blood, and CSF, a 2 ml. volume of Isovue-250 was injected into the epidural space and the flow of contrast was observed. Radiographs were obtained for documentation purposes.    The injectate was administered into the level noted above.   Additional Comments:  The patient tolerated the procedure well Dressing: 2 x 2 sterile gauze and Band-Aid    Post-procedure details: Patient was observed during the procedure. Post-procedure instructions were reviewed.  Patient left the clinic in stable condition.    Clinical History: MRI LUMBAR SPINE WITHOUT CONTRAST  TECHNIQUE: Multiplanar, multisequence MR imaging of the  lumbar spine was performed. No intravenous contrast was administered.  COMPARISON:  Plain films 02/04/2016.  FINDINGS: Segmentation:  Standard.  Alignment: Severe degenerative scoliosis convex RIGHT related to loss of interspace height on the LEFT at L3-4 and L2-3. Leftward translation  L1 on L2. Trace anterolisthesis L3-L4. As correlated with plain films, scoliosis measures greater than 30 degrees.  Vertebrae: Other than endplate reactive changes most prominent at L3-4, no worrisome or significant osseous findings.  Conus medullaris and cauda equina: Conus extends to the L1 level. Conus and cauda equina appear normal.  Paraspinal and other soft tissues: BILATERAL renal cystic disease incompletely evaluated.  Disc levels:  L1-L2: Central protrusion. Posterior element hypertrophy, slightly worse on the LEFT. LEFT greater than RIGHT L2 nerve root impingement. LEFT L1 nerve root impingement due to foraminal narrowing. Mild to moderate stenosis is superimposed.  L2-L3: Annular bulge. Posterior element hypertrophy. Borderline stenosis. Asymmetric loss of interspace height on the LEFT. LEFT L2 and L3 nerve root impingement.  L3-L4: Near complete loss of interspace height at L3-4 on the LEFT. Marked osseous spurring. Posterior element hypertrophy. Central and leftward protrusion is superimposed. Trace anterolisthesis. Moderate to severe stenosis. Severe LEFT L3 and L4 nerve root impingement.  L4-L5: Good disc height and hydration. Posterior element hypertrophy. Annular bulge. Mild stenosis. Borderline subarticular zone narrowing on the RIGHT could affect the L5 nerve root.  L5-S1: Disc desiccation. Annular bulge. Facet arthropathy. No impingement.  IMPRESSION: The dominant abnormality is at L3-L4 where near complete loss of interspace height on the LEFT contributes to significant dextroconvex scoliosis (greater than 30 degrees), and bony overgrowth. Asymmetric posterior element hypertrophy along with leftward disc protrusion results in moderate to severe stenosis and LEFT L3/LEFT L4 nerve root impingement.  Similar less severe changes at L2-3 on the LEFT, with asymmetric loss of interspace is height in conjunction with annular bulge and posterior  element hypertrophy result in L2 and L3 nerve root impingement at that level.  Leftward translation L1 on L2. Central protrusion with posterior element hypertrophy. Correlate clinically for symptomatic LEFT L1 and LEFT L2 nerve root impingement.  Marked BILATERAL renal cystic disease, incompletely evaluated. If further investigation desired, consider sonography.   Electronically Signed   By: Staci Righter M.D.   On: 12/20/2016 07:56  02/04/2016 Lspine 4v Xray  AP and lateral lumbar spine x-ray shows moderate right-sided scoliosis  with a dextro rotatory component centered at about the L3-L4 region. There  is some rotation of the pelvis with some degenerative changes of the right  sacroiliac joint with fairly normal-appearing left sacroiliac joint. Hip  joints appear to show only mild degenerative changes with some decent  joint spaces. There are no fractures or dislocations.     Objective:  VS:  HT:    WT:   BMI:     BP:(!) 143/85  HR:77bpm  TEMP: ( )  RESP:  Physical Exam  Ortho Exam Imaging: No results found.

## 2019-05-23 NOTE — Progress Notes (Signed)
HEMATOLOGY-ONCOLOGY TELEPHONE VISIT PROGRESS NOTE  I connected with Deborah Bell on 05/24/2019 at  2:15 PM EDT by telephone and verified that I am speaking with the correct person using two identifiers.  I discussed the limitations, risks, security and privacy concerns of performing an evaluation and management service by telephone and the availability of in person appointments.  I also discussed with the patient that there may be a patient responsible charge related to this service. The patient expressed understanding and agreed to proceed.   History of Present Illness: Deborah Bell is a 86 y.o. female with above-mentioned history of right breast cancer currently on neoadjuvant anti-estrogen therapy with anastrozole. Breast MRI on 04/02/19 showed decrease in the right breast mass from 4.8cm previously to 3.3cm, and decrease in size of the addtional mass, now 0.4cm.She presents over the phone today for follow-up.    Oncology History  Malignant neoplasm of upper-inner quadrant of right breast in female, estrogen receptor positive ([place])  11/15/2018 Initial Diagnosis   61-monthhistory of enlarging palpable lump in the right breast, mammogram revealed spiculated mass with architectural distortion.  Mass measures 2 cm but with the spiculations measures 4 cm, extensive involvement of overlying skin, left breast 8 mm calcifications, biopsy right breast mass and lymph node positive for invasive lobular cancer, lymph node also positive with extranodal extension, ER 95%, PR 100%, Ki-67 2%, HER-2 2+ by IHC but FISH negative ratio 1.44, left breast biopsy benign   11/22/2018 Cancer Staging   Staging form: Breast, AJCC 8th Edition - Clinical stage from 11/22/2018: Stage IIIB (cT4, cN1, cM0, G2, ER+, PR+, HER2-) - Signed by GNicholas Lose MD on 11/22/2018   11/22/2018 -  Neo-Adjuvant Anti-estrogen oral therapy   Anastrozole     Observations/Objective:     Assessment Plan:  Malignant neoplasm of  upper-inner quadrant of right breast in female, [place] 11/15/2018:626-monthistory of enlarging palpable lump in the right breast, mammogram revealed spiculated mass with architectural distortion. Mass measures 2 cm but with the spiculations measures 4 cm, extensive involvement of overlying skin, left breast 8 mm calcifications, biopsy right breast mass and lymph node positive for invasive lobular cancer, lymph node also positive with extranodal extension, ER 95%, PR 100%, Ki-67 2%, HER-2 2+ by IHC but FISH negative ratio 1.44, left breast biopsy benign  T4N1 stage IIIB  Recommendation: 1.Neoadjuvant antiestrogen therapywith anastrozole 1 mg daily started 11/22/2018 2.Follow-up breast conserving surgery with targeted node dissection 3.Adjuvant radiation therapy 4.Follow-up adjuvant antiestrogen therapy ------------------------------------------------------------------------------------------------------------------------------- 12/13/2018: Breast MRI: Biopsy-proven malignancy 4.8 x 4.8 cm with extension to the skin. Additional 1.4 cm mass UIQ right breast. Single right axillary lymph node.  Anastrozole toxicities:Denies any side effects to anastrozole  12/27/2018: Bone scan: Mild uptake in the lower thoracic spine and moderate uptake in the lumbar spine is nonspecific and could be degenerative changes 12/28/2018: CT CAP: No acute findings in chest abdomen pelvis. Atherosclerosis, large ventral hernia  Mammogram and ultrasound 02/20/2019: The spiculated right breast mass measures 1.8 cm (previously was 2.6 cm) overlying skin thickening also reduced.  Right axillary lymph node 10 mm (previously was 8 mm)  Breast MRI 04/03/2019: Interval decrease in enhancement to 3.3 cm (previously 4.8 cm) extension to and enhancement of the overlying skin is noted.  Decrease in the size of the additional mass 4 mm currently.  No abnormal lymph nodes  Recommendation:   After discussing pros and cons of surgery, the decision is made to not proceed with surgery. Continue  with current antiestrogen therapy for palliation Repeat mammogram and ultrasound in 6 months and follow-up after that with a MyChart virtual visit   I discussed the assessment and treatment plan with the patient. The patient was provided an opportunity to ask questions and all were answered. The patient agreed with the plan and demonstrated an understanding of the instructions. The patient was advised to call back or seek an in-person evaluation if the symptoms worsen or if the condition fails to improve as anticipated.   I provided 20 minutes of non-face-to-face time during this encounter.   Rulon Eisenmenger, MD 05/24/2019    I, Molly Dorshimer, am acting as scribe for Nicholas Lose, MD.  I have reviewed the above documentation for accuracy and completeness, and I agree with the above.

## 2019-05-24 ENCOUNTER — Inpatient Hospital Stay: Payer: Medicare Other | Attending: Hematology and Oncology | Admitting: Hematology and Oncology

## 2019-05-24 DIAGNOSIS — C50211 Malignant neoplasm of upper-inner quadrant of right female breast: Secondary | ICD-10-CM | POA: Insufficient documentation

## 2019-05-24 DIAGNOSIS — Z79811 Long term (current) use of aromatase inhibitors: Secondary | ICD-10-CM | POA: Diagnosis not present

## 2019-05-24 DIAGNOSIS — Z923 Personal history of irradiation: Secondary | ICD-10-CM | POA: Insufficient documentation

## 2019-05-24 DIAGNOSIS — Z170 Estrogen receptor positive status [ER+]: Secondary | ICD-10-CM | POA: Insufficient documentation

## 2019-05-24 NOTE — Assessment & Plan Note (Signed)
11/15/2018:47-monthhistory of enlarging palpable lump in the right breast, mammogram revealed spiculated mass with architectural distortion. Mass measures 2 cm but with the spiculations measures 4 cm, extensive involvement of overlying skin, left breast 8 mm calcifications, biopsy right breast mass and lymph node positive for invasive lobular cancer, lymph node also positive with extranodal extension, ER 95%, PR 100%, Ki-67 2%, HER-2 2+ by IHC but FISH negative ratio 1.44, left breast biopsy benign  T4N1 stage IIIB  Recommendation: 1.Neoadjuvant antiestrogen therapywith anastrozole 1 mg daily started 11/22/2018 2.Follow-up breast conserving surgery with targeted node dissection 3.Adjuvant radiation therapy 4.Follow-up adjuvant antiestrogen therapy ------------------------------------------------------------------------------------------------------------------------------- 12/13/2018: Breast MRI: Biopsy-proven malignancy 4.8 x 4.8 cm with extension to the skin. Additional 1.4 cm mass UIQ right breast. Single right axillary lymph node.  Anastrozole toxicities:Denies any side effects to anastrozole  12/27/2018: Bone scan: Mild uptake in the lower thoracic spine and moderate uptake in the lumbar spine is nonspecific and could be degenerative changes 12/28/2018: CT CAP: No acute findings in chest abdomen pelvis. Atherosclerosis, large ventral hernia  Mammogram and ultrasound 02/20/2019: The spiculated right breast mass measures 1.8 cm (previously was 2.6 cm) overlying skin thickening also reduced.  Right axillary lymph node 10 mm (previously was 8 mm)  Breast MRI 04/03/2019: Interval decrease in enhancement to 3.3 cm (previously 4.8 cm) extension to and enhancement of the overlying skin is noted.  Decrease in the size of the additional mass 4 mm currently.  No abnormal lymph nodes  Recommendation: Follow-up with surgery regarding lumpectomy with targeted node dissection. Return to  clinic after surgery to discuss pathology results.

## 2019-05-25 ENCOUNTER — Telehealth: Payer: Self-pay | Admitting: Hematology and Oncology

## 2019-05-25 NOTE — Telephone Encounter (Signed)
Scheduled per 05/06 los, patient has been called and voicemail was left.

## 2019-06-12 ENCOUNTER — Telehealth: Payer: Self-pay | Admitting: Physical Medicine and Rehabilitation

## 2019-06-12 NOTE — Telephone Encounter (Signed)
ok 

## 2019-06-12 NOTE — Telephone Encounter (Signed)
Patient's daughter is requesting another injection for her mother. Right L4-5 [place] on 03/07/19. Ok to repeat if helped, same problem/side, and no new injury?

## 2019-06-12 NOTE — Telephone Encounter (Signed)
Scheduled for 6/16 at 1500 with driver and no blood thinners.

## 2019-07-04 ENCOUNTER — Encounter: Payer: Medicare Other | Admitting: Physical Medicine and Rehabilitation

## 2019-11-21 ENCOUNTER — Other Ambulatory Visit: Payer: Self-pay | Admitting: Hematology and Oncology

## 2019-11-27 ENCOUNTER — Inpatient Hospital Stay: Payer: Medicare Other | Attending: Hematology and Oncology | Admitting: Hematology and Oncology

## 2019-11-27 NOTE — Assessment & Plan Note (Deleted)
11/15/2018:77-monthhistory of enlarging palpable lump in the right breast, mammogram revealed spiculated mass with architectural distortion. Mass measures 2 cm but with the spiculations measures 4 cm, extensive involvement of overlying skin, left breast 8 mm calcifications, biopsy right breast mass and lymph node positive for invasive lobular cancer, lymph node also positive with extranodal extension, ER 95%, PR 100%, Ki-67 2%, HER-2 2+ by IHC but FISH negative ratio 1.44, left breast biopsy benign  T4N1 stage IIIB  Recommendation: 1.Neoadjuvant antiestrogen therapywith anastrozole 1 mg daily started 11/22/2018 2.Follow-up breast conserving surgery with targeted node dissection 3.Adjuvant radiation therapy 4.Follow-up adjuvant antiestrogen therapy ------------------------------------------------------------------------------------------------------------------------------- 12/13/2018: Breast MRI: Biopsy-proven malignancy 4.8 x 4.8 cm with extension to the skin. Additional 1.4 cm mass UIQ right breast. Single right axillary lymph node.  Anastrozole toxicities:Denies any side effects to anastrozole  12/27/2018: Bone scan: Mild uptake in the lower thoracic spine and moderate uptake in the lumbar spine is nonspecific and could be degenerative changes 12/28/2018: CT CAP: No acute findings in chest abdomen pelvis. Atherosclerosis, large ventral hernia  Mammogram and ultrasound 02/20/2019: The spiculated right breast mass measures 1.8 cm (previously was 2.6 cm) overlying skin thickening also reduced. Right axillary lymph node 10 mm (previously was 8 mm)  Breast MRI 04/03/2019: Interval decrease in enhancement to 3.3 cm (previously 4.8 cm) extension to and enhancement of the overlying skin is noted.  Decrease in the size of the additional mass 4 mm currently.  No abnormal lymph nodes  Current treatment: Palliative antiestrogen therapy without surgery. Return to clinic in 6 months with  mammogram and ultrasound and follow-up.

## 2020-01-29 ENCOUNTER — Telehealth: Payer: Self-pay | Admitting: Hematology and Oncology

## 2020-01-29 NOTE — Telephone Encounter (Signed)
Release: [id number] Faxed medical records to pt's new Dr, Dr. [facility] @ fax [phone]

## 2020-10-22 IMAGING — CT CT CHEST W/ CM
2 of 5 series · 11 of 46 positions shown, 12 images · IV contrast (iopamidol)
Comparison: None.

CLINICAL DATA: Breast cancer. Staging.

EXAM:
CT CHEST, ABDOMEN, AND PELVIS WITH CONTRAST
TECHNIQUE: Multidetector CT imaging of the chest, abdomen and pelvis was
performed following the standard protocol during bolus
administration of intravenous contrast.
CONTRAST:  100mL 3ROHBO-I00 IOPAMIDOL (3ROHBO-I00) INJECTION 61%

[Series 2: cap with 5.00 br40 s3 axial · axial · 0.54mm/px · z∈[+1464,+1939]mm · 8 of 115 slices shown, 9 images]
[im 10/115  soft-tissue]
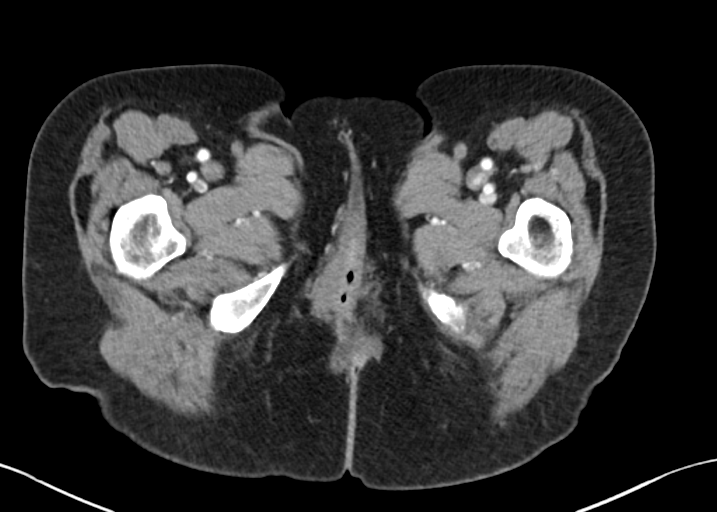
[im 10/115  bone]
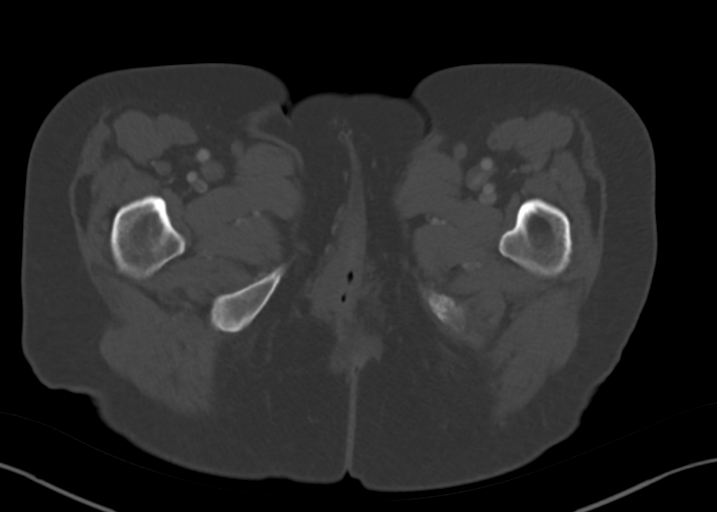
[im 29/115  soft-tissue]
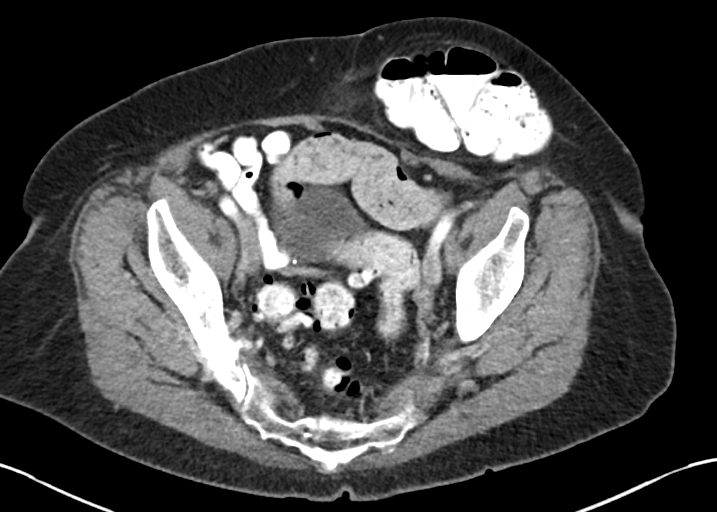
[im 39/115  soft-tissue]
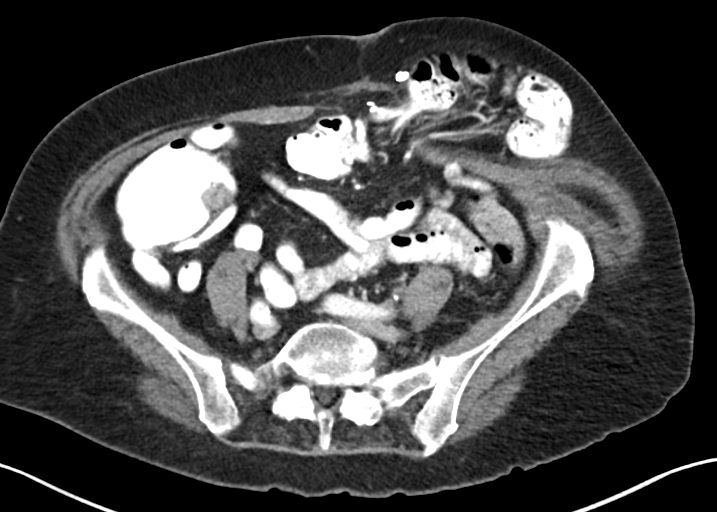
[im 48/115  soft-tissue]
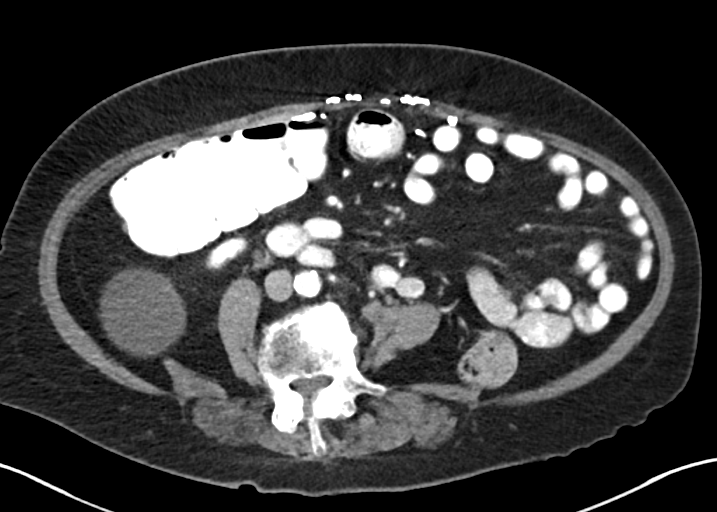
[im 67/115  soft-tissue]
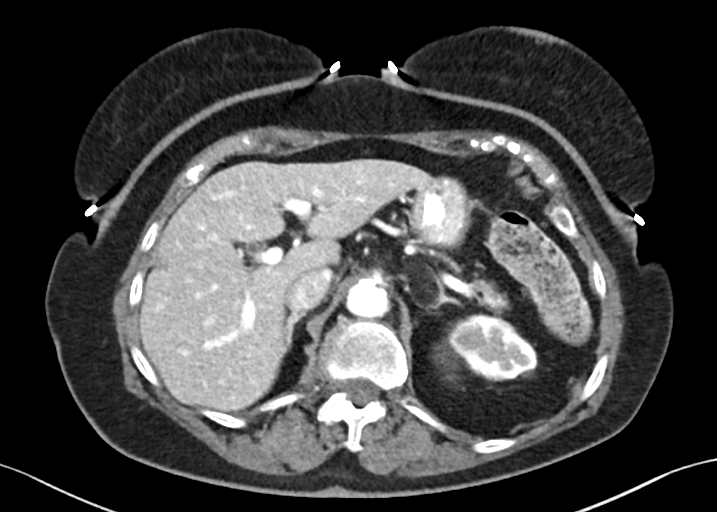
[im 77/115  soft-tissue]
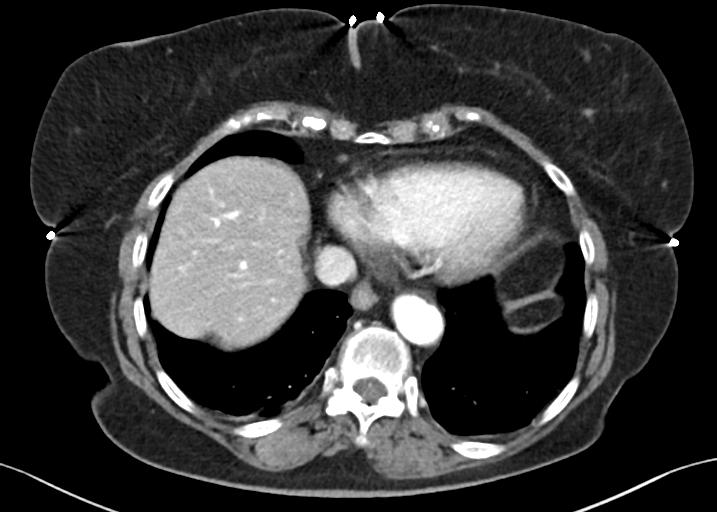
[im 86/115  soft-tissue]
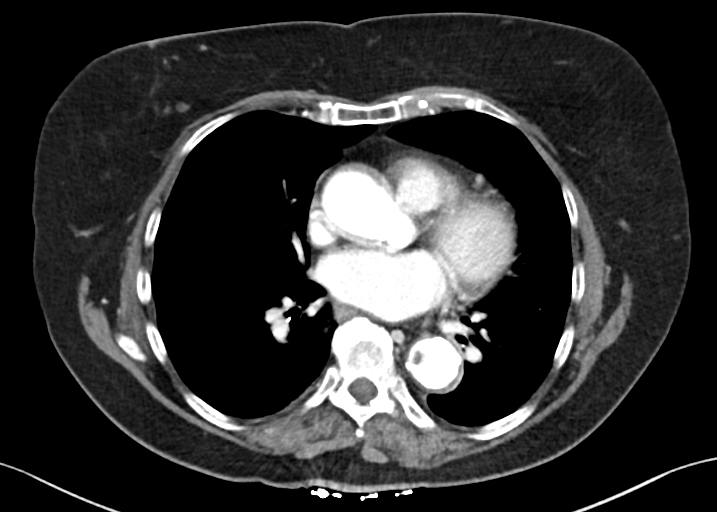
[im 105/115  soft-tissue]
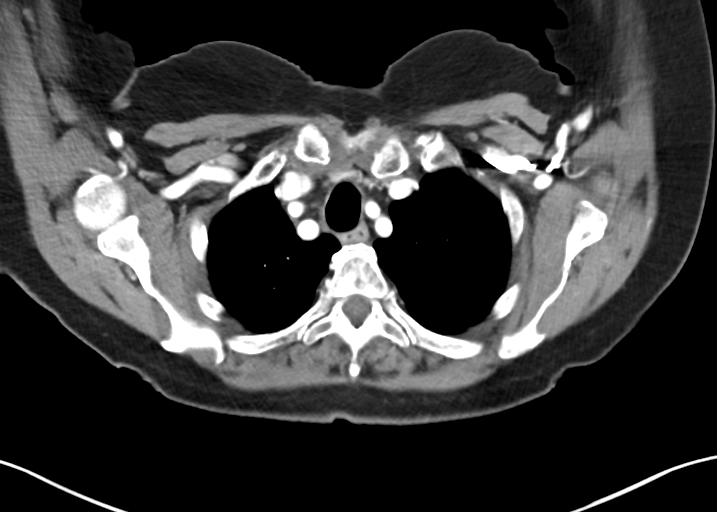

[Series 6: cap with 2.00 br40 s3 cor · coronal · 0.75mm/px · 3 of 144 slices shown]
[im 48/144  soft-tissue]
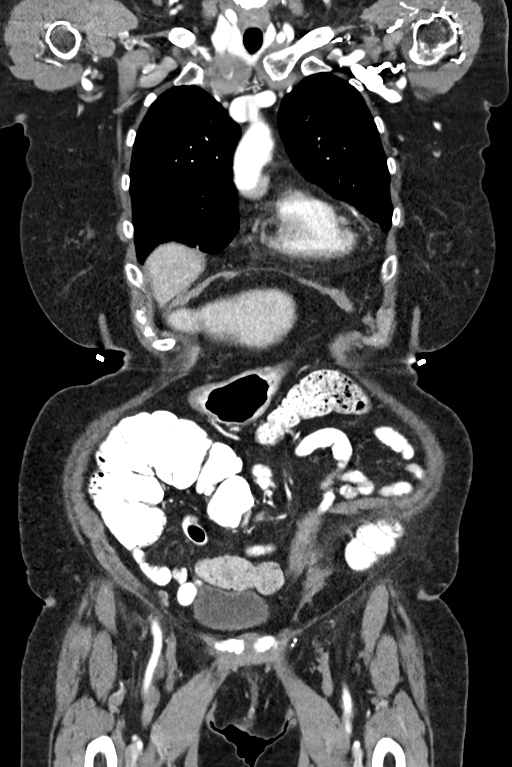
[im 64/144  soft-tissue]
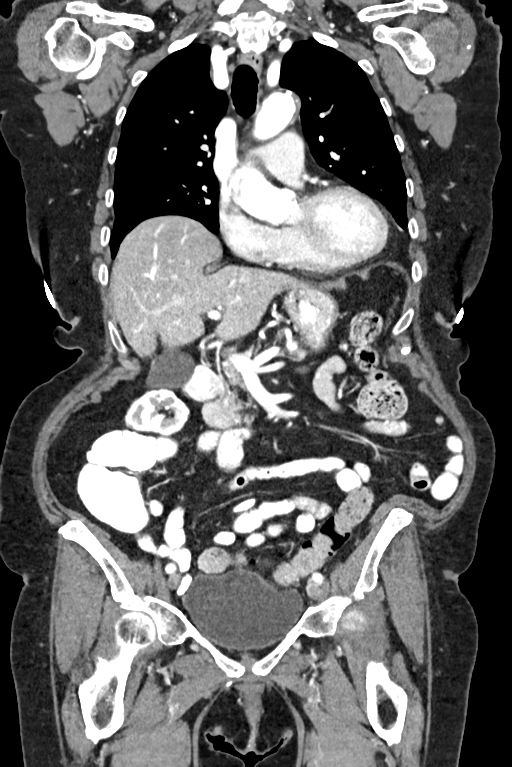
[im 80/144  soft-tissue]
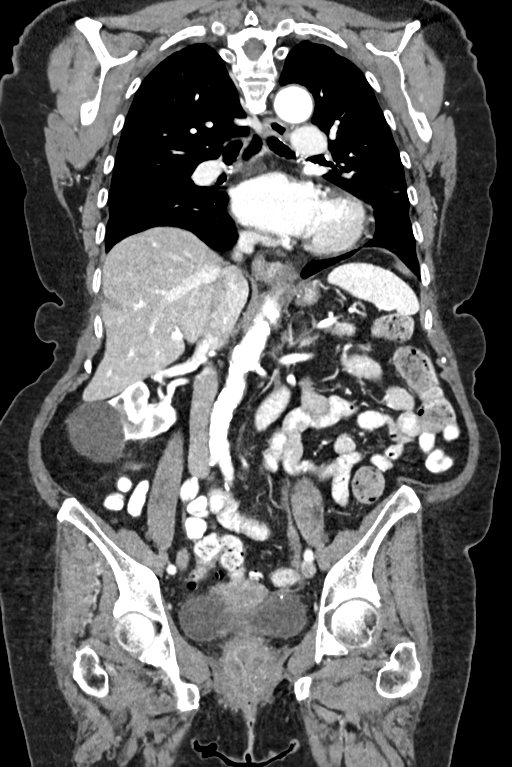

[11 of 46 positions shown; findings below may reference images not displayed]

FINDINGS: CT CHEST FINDINGS

Cardiovascular: Mild cardiac enlargement. Aortic atherosclerosis.
There is focal dilatation of the mid descending thoracic aorta which
measures up to 2.9 cm. Large noncalcified plaque is identified
extending into the lumen of the aorta measuring 1.1 cm.

Mediastinum/Nodes: Normal appearance of the thyroid gland. The
trachea appears patent and is midline. Normal appearance of the
esophagus. No mediastinal or hilar adenopathy identified. No
axillary or supraclavicular adenopathy.

Lungs/Pleura: Scarring identified within the lung bases. No pleural
effusion, airspace consolidation or pneumothorax. There is a tiny
nonspecific nodule in the posterior left upper lobe measuring 3 mm.

Musculoskeletal: Spondylosis is noted throughout the thoracic spine.
No aggressive lytic or sclerotic bone lesions identified.

CT ABDOMEN PELVIS FINDINGS

Hepatobiliary: 3 mm low-density structure in the right lobe of liver
is too small to characterize. No suspicious liver lesion noted.
Gallbladder unremarkable. No biliary ductal dilatation.

Pancreas: Unremarkable. No pancreatic ductal dilatation or
surrounding inflammatory changes.

Spleen: Normal in size without focal abnormality.

Adrenals/Urinary Tract: Right adrenal gland normal. Benign fat
density lesion within the left adrenal gland measures 2.5 cm
compatible with a benign myelolipoma. Bilateral kidney cysts are
noted. The largest arises from inferior pole of right kidney
measuring 5.7 cm. Bilateral renal cortical scarring and mild atrophy
noted, left greater than right. The urinary bladder is unremarkable.

Stomach/Bowel: Stomach is normal. No bowel wall thickening,
inflammation or distension. There is a large ventral abdominal wall
hernia containing nonobstructed loops of colon. Scattered colonic
diverticulosis noted without acute inflammation.

Vascular/Lymphatic: Aortic atherosclerosis. No aneurysm. No
abdominopelvic adenopathy.

Reproductive: Uterus and bilateral adnexa are unremarkable.

Other: No abdominal wall hernia or abnormality. No abdominopelvic
ascites.

Musculoskeletal: Thoracolumbar scoliosis and degenerative disc
disease. No suspicious bone lesions.
IMPRESSION: 1. No acute findings within the chest, abdomen or pelvis.
2. No specific findings identified to suggest metastatic disease.
3. Aortic atherosclerosis. Large noncalcified plaque is identified
extending into the lumen of the aorta measuring 1.1 cm.
4. Large ventral abdominal wall hernia containing nonobstructed
loops of colon.
5. Left adrenal gland myelolipoma.
6. Aortic atherosclerosis.

Aortic Atherosclerosis (LVO3T-XDW.W).

## 2020-10-22 IMAGING — CT CT ABD-PELV W/ CM
2 of 5 series · 11 of 46 positions shown, 12 images · IV contrast (iopamidol)
Comparison: None.

CLINICAL DATA: Breast cancer. Staging.

EXAM:
CT CHEST, ABDOMEN, AND PELVIS WITH CONTRAST
TECHNIQUE: Multidetector CT imaging of the chest, abdomen and pelvis was
performed following the standard protocol during bolus
administration of intravenous contrast.
CONTRAST:  100mL 3ROHBO-I00 IOPAMIDOL (3ROHBO-I00) INJECTION 61%

[Series 2: cap with 5.00 br40 s3 axial · axial · 0.54mm/px · z∈[+1464,+1939]mm · 8 of 115 slices shown, 9 images]
[im 10/115  soft-tissue]
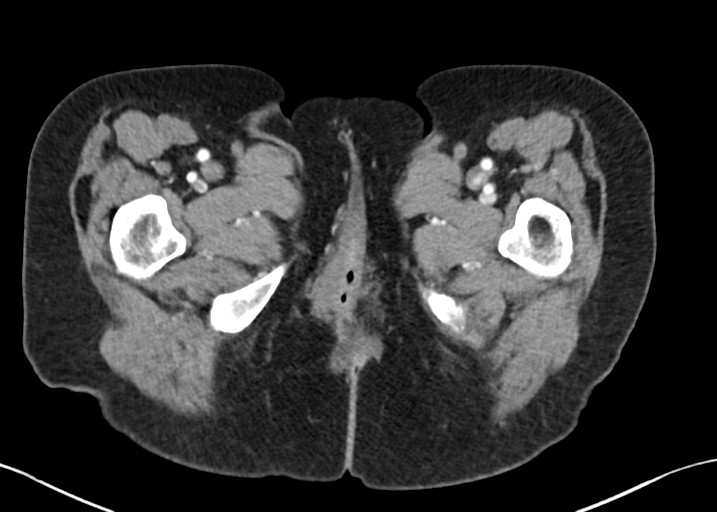
[im 10/115  bone]
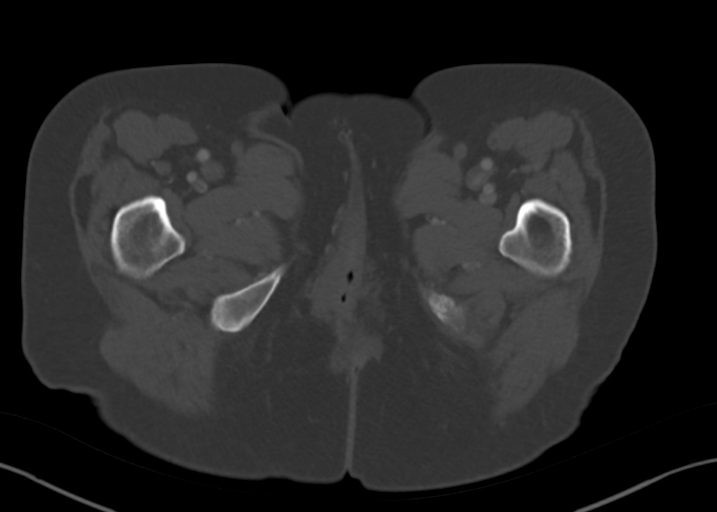
[im 29/115  soft-tissue]
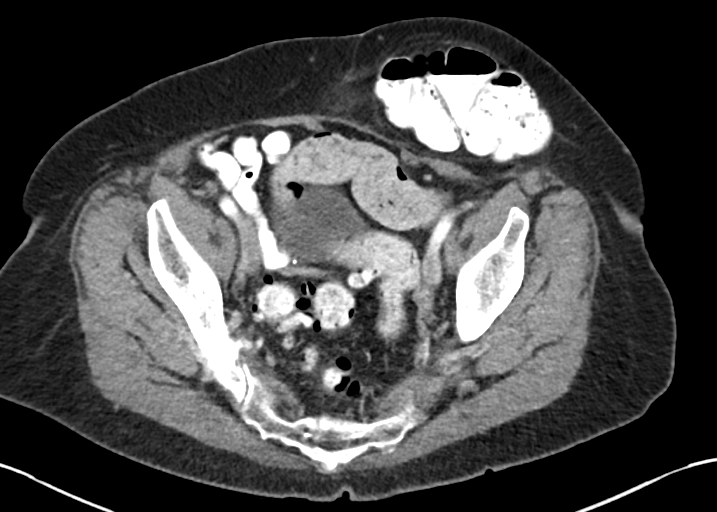
[im 39/115  soft-tissue]
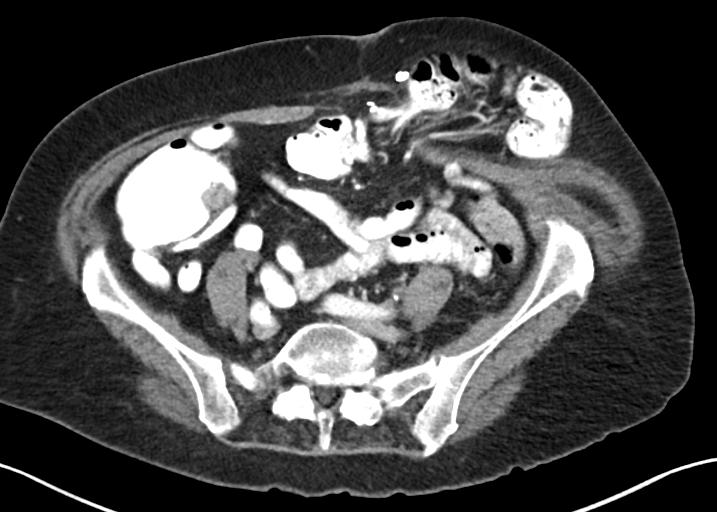
[im 48/115  soft-tissue]
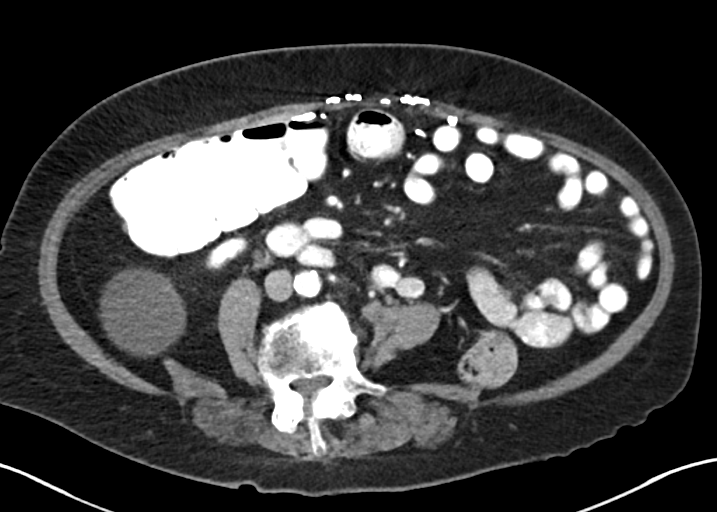
[im 67/115  soft-tissue]
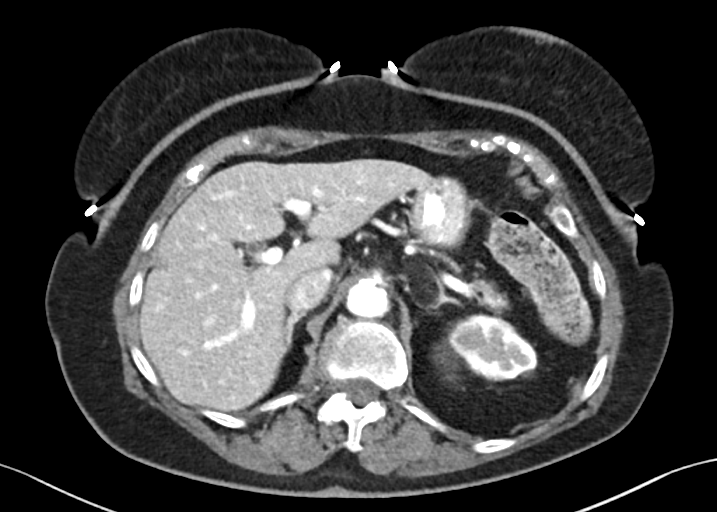
[im 77/115  soft-tissue]
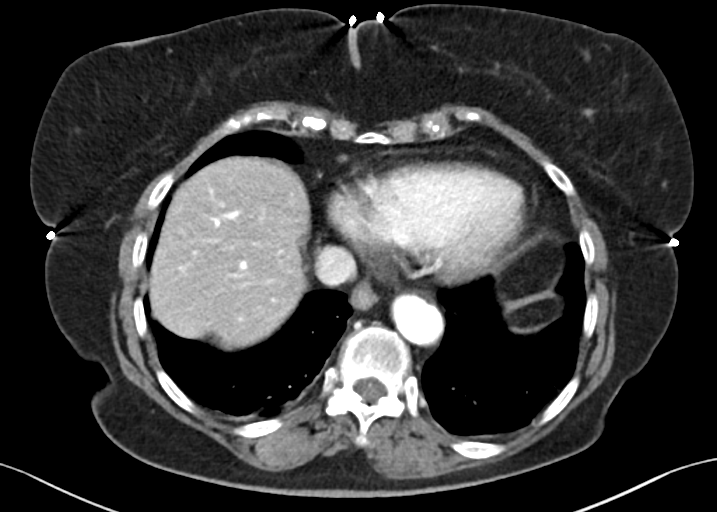
[im 86/115  soft-tissue]
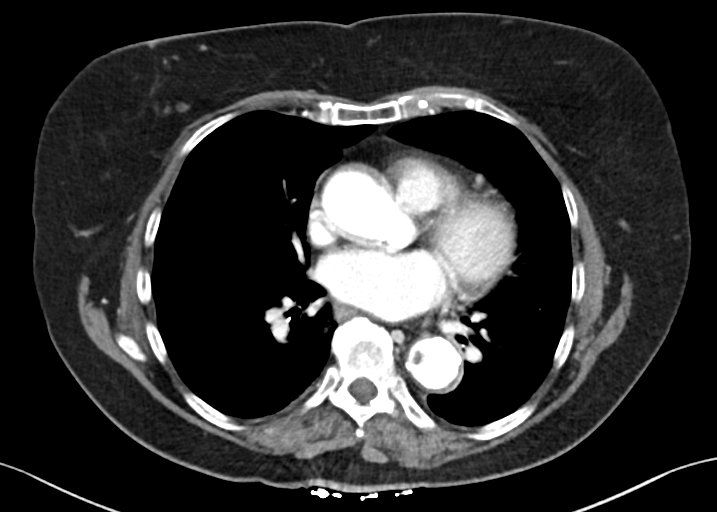
[im 105/115  soft-tissue]
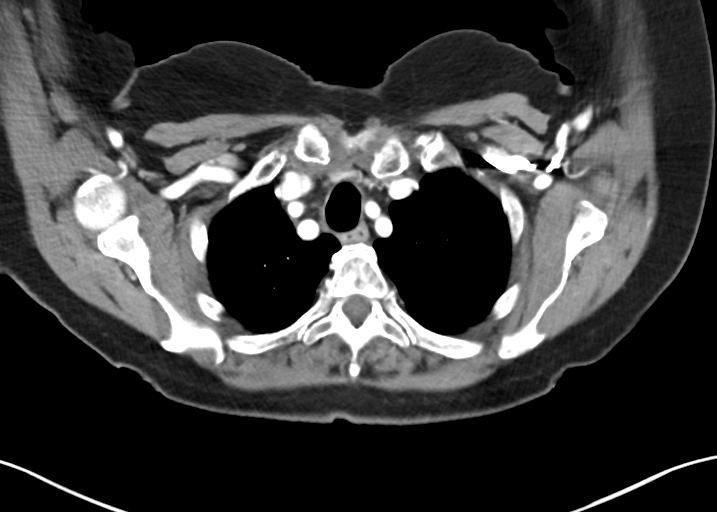

[Series 6: cap with 2.00 br40 s3 cor · coronal · 0.75mm/px · 3 of 144 slices shown]
[im 48/144  soft-tissue]
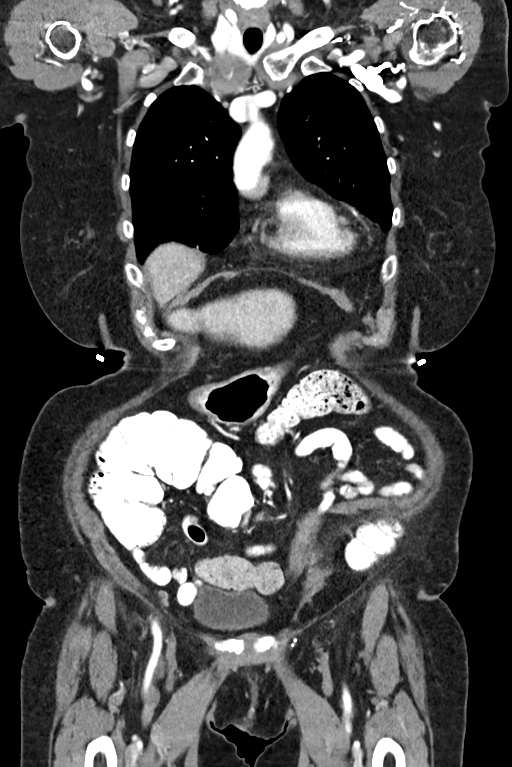
[im 64/144  soft-tissue]
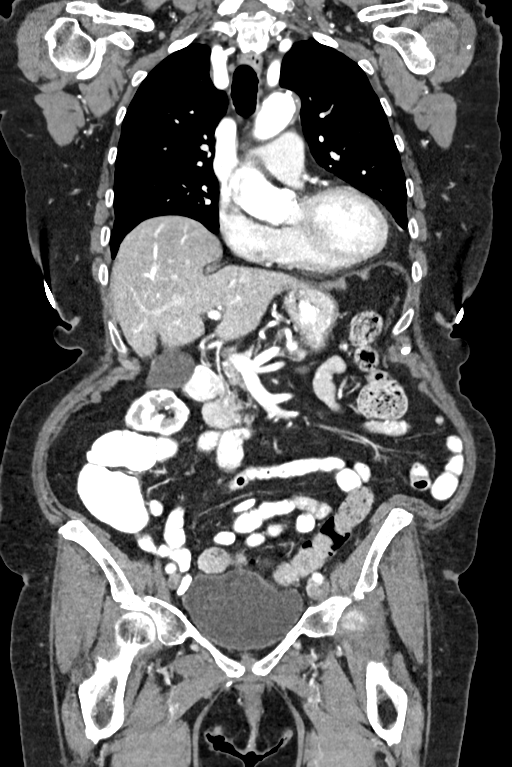
[im 80/144  soft-tissue]
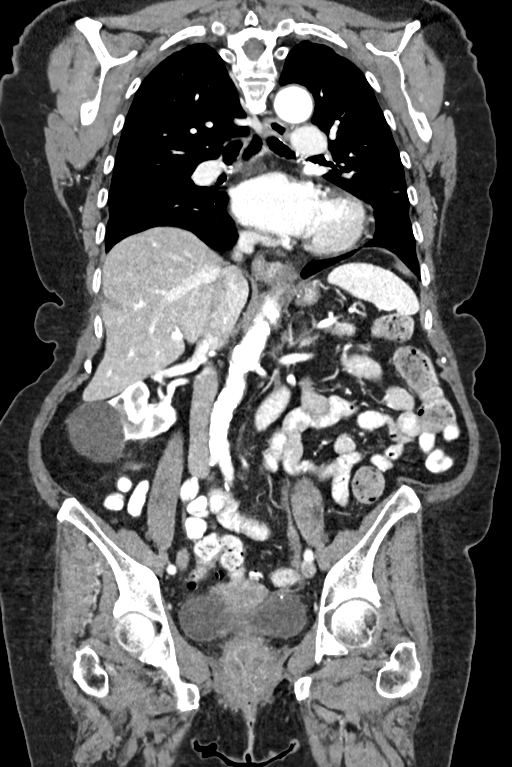

[11 of 46 positions shown; findings below may reference images not displayed]

FINDINGS: CT CHEST FINDINGS

Cardiovascular: Mild cardiac enlargement. Aortic atherosclerosis.
There is focal dilatation of the mid descending thoracic aorta which
measures up to 2.9 cm. Large noncalcified plaque is identified
extending into the lumen of the aorta measuring 1.1 cm.

Mediastinum/Nodes: Normal appearance of the thyroid gland. The
trachea appears patent and is midline. Normal appearance of the
esophagus. No mediastinal or hilar adenopathy identified. No
axillary or supraclavicular adenopathy.

Lungs/Pleura: Scarring identified within the lung bases. No pleural
effusion, airspace consolidation or pneumothorax. There is a tiny
nonspecific nodule in the posterior left upper lobe measuring 3 mm.

Musculoskeletal: Spondylosis is noted throughout the thoracic spine.
No aggressive lytic or sclerotic bone lesions identified.

CT ABDOMEN PELVIS FINDINGS

Hepatobiliary: 3 mm low-density structure in the right lobe of liver
is too small to characterize. No suspicious liver lesion noted.
Gallbladder unremarkable. No biliary ductal dilatation.

Pancreas: Unremarkable. No pancreatic ductal dilatation or
surrounding inflammatory changes.

Spleen: Normal in size without focal abnormality.

Adrenals/Urinary Tract: Right adrenal gland normal. Benign fat
density lesion within the left adrenal gland measures 2.5 cm
compatible with a benign myelolipoma. Bilateral kidney cysts are
noted. The largest arises from inferior pole of right kidney
measuring 5.7 cm. Bilateral renal cortical scarring and mild atrophy
noted, left greater than right. The urinary bladder is unremarkable.

Stomach/Bowel: Stomach is normal. No bowel wall thickening,
inflammation or distension. There is a large ventral abdominal wall
hernia containing nonobstructed loops of colon. Scattered colonic
diverticulosis noted without acute inflammation.

Vascular/Lymphatic: Aortic atherosclerosis. No aneurysm. No
abdominopelvic adenopathy.

Reproductive: Uterus and bilateral adnexa are unremarkable.

Other: No abdominal wall hernia or abnormality. No abdominopelvic
ascites.

Musculoskeletal: Thoracolumbar scoliosis and degenerative disc
disease. No suspicious bone lesions.
IMPRESSION: 1. No acute findings within the chest, abdomen or pelvis.
2. No specific findings identified to suggest metastatic disease.
3. Aortic atherosclerosis. Large noncalcified plaque is identified
extending into the lumen of the aorta measuring 1.1 cm.
4. Large ventral abdominal wall hernia containing nonobstructed
loops of colon.
5. Left adrenal gland myelolipoma.
6. Aortic atherosclerosis.

Aortic Atherosclerosis (LVO3T-XDW.W).

## 2020-12-08 IMAGING — US US BREAST*R* LIMITED INC AXILLA
2 series · 13 of 13 positions shown · non-contrast
Comparison: Previous exam(s).
COMPARISON: Previous exam(s).
COMPARISON: Previous exam(s).

Addendum:
CLINICAL DATA: Biopsy proven invasive mammary carcinoma of the
right breast and metastatic axillary adenopathy. Assess response to
neoadjuvant antiestrogen therapy.

EXAM:
DIGITAL DIAGNOSTIC RIGHT MAMMOGRAM WITH CAD AND TOMO
ULTRASOUND RIGHT BREAST

[Series 1: us breast*right* limited inc axilla · 0.06mm/px · 9 of 9 slices shown (1 of 2)]
[im 1/9]
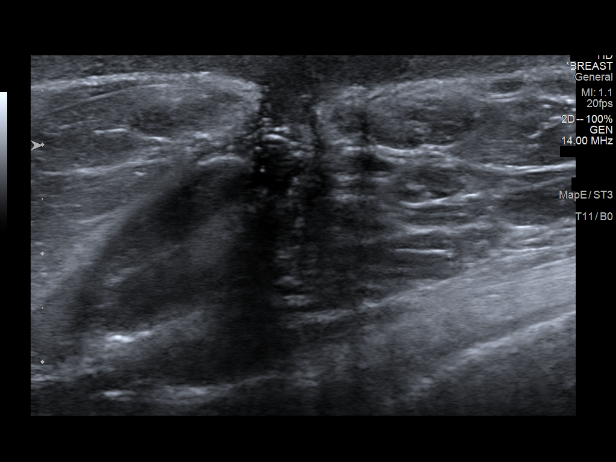
[im 2/9]
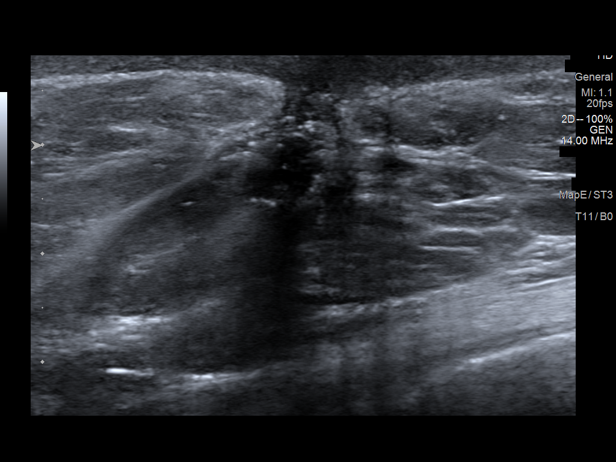
[im 3/9]
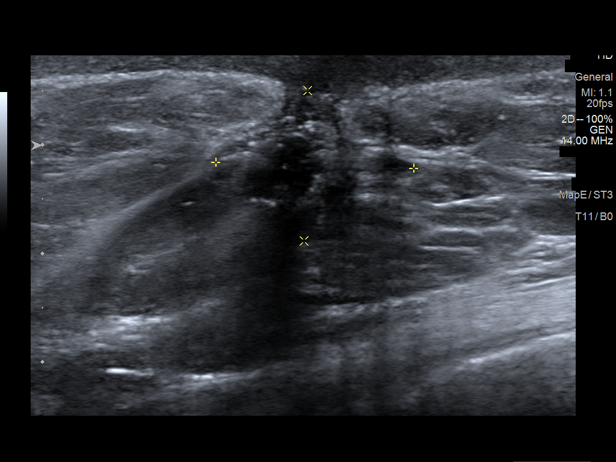
[im 4/9]
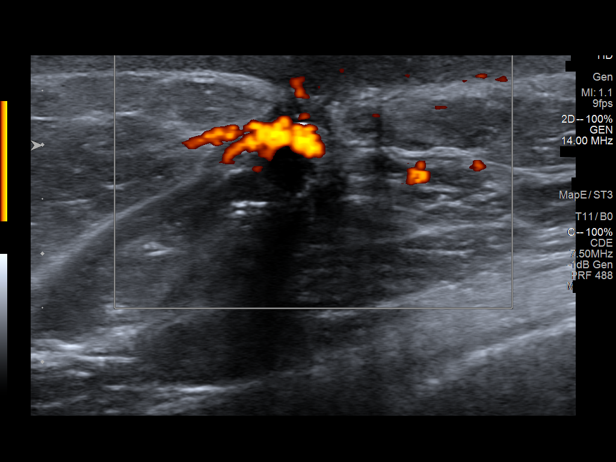
[im 5/9]
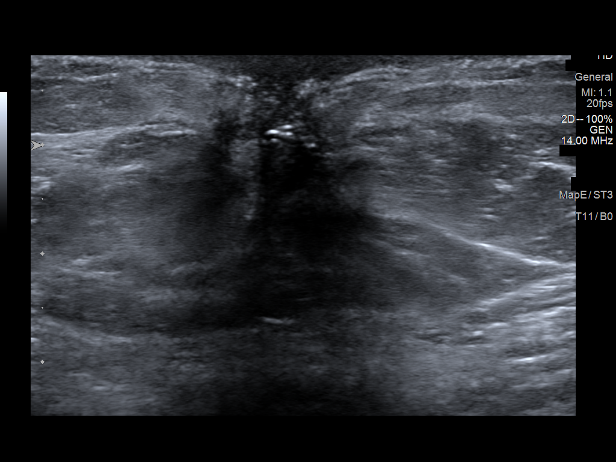
[im 6/9]
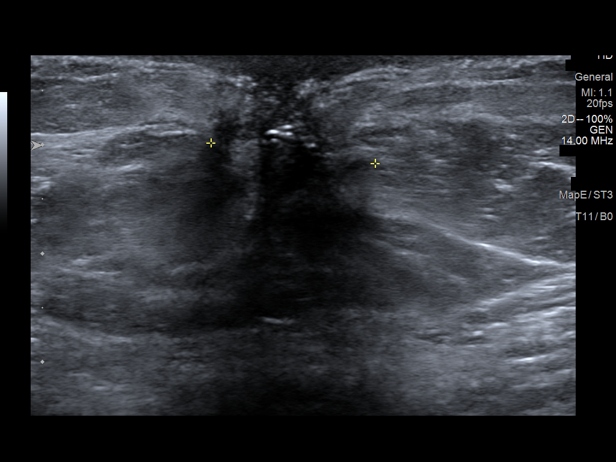
[im 7/9]
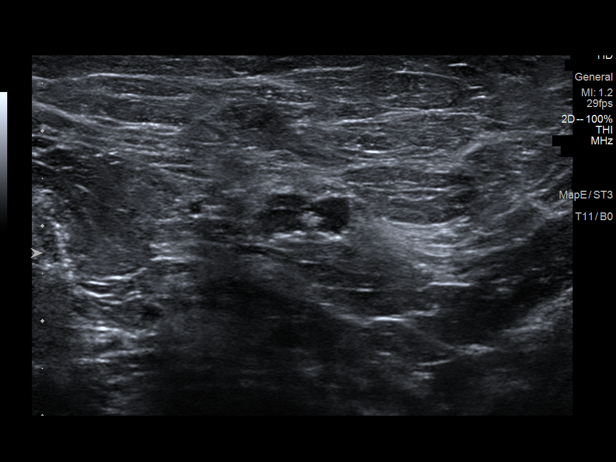
[im 8/9]
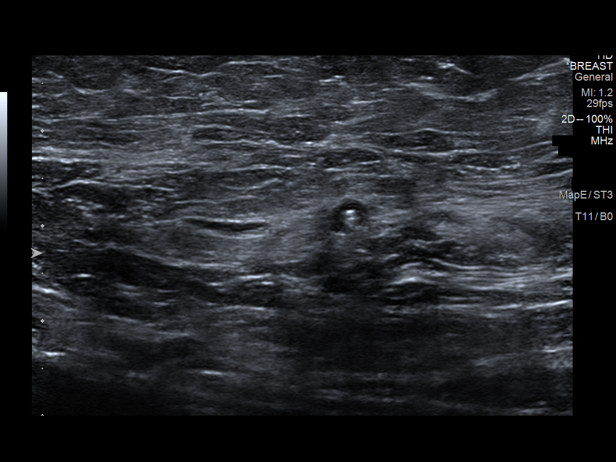
[im 9/9]
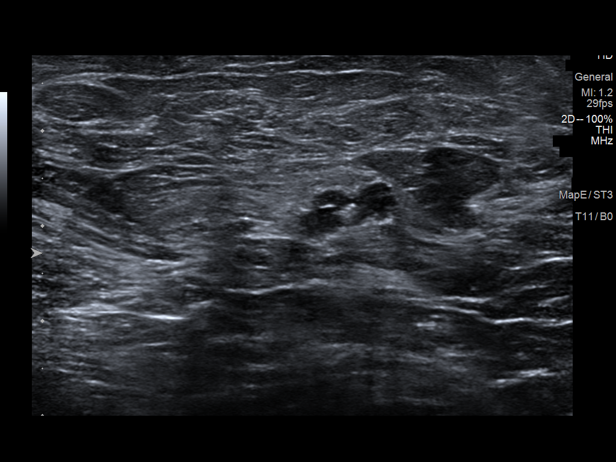

[Series 2: us breast*right* limited inc axilla · 0.06mm/px · 4 of 4 slices shown (2 of 2)]
[im 1/4]
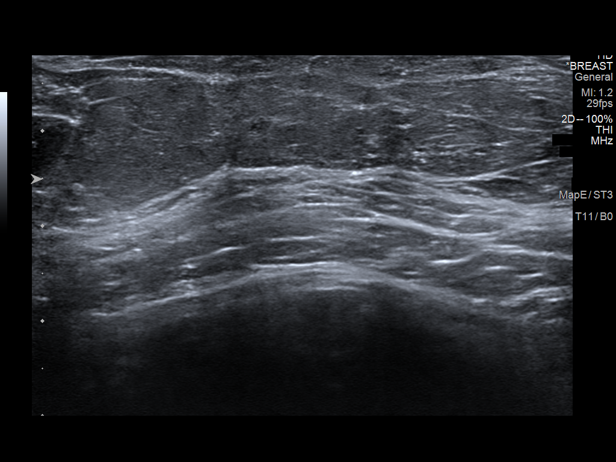
[im 2/4]
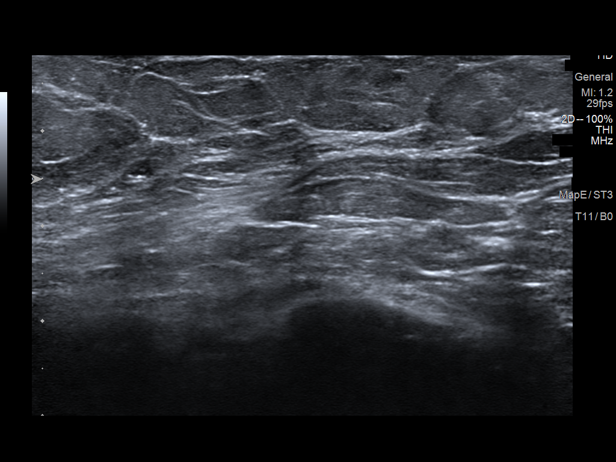
[im 3/4]
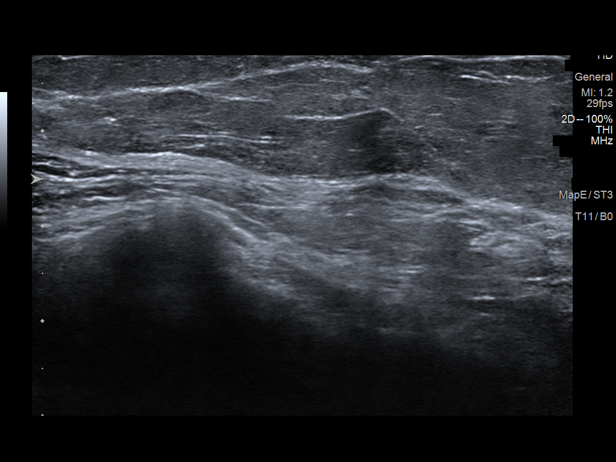
[im 4/4]
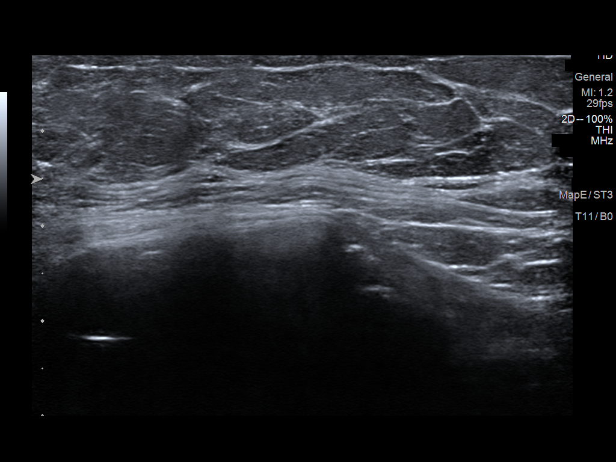

[13 of 13 positions shown; findings below may reference images not displayed]

ACR Breast Density Category b: There are scattered areas of
fibroglandular density.
FINDINGS: The spiculated mass in the upper-outer quadrant of the right breast
appears smaller than the prior exam dated 11/10/2018. There is a
ribbon shaped biopsy marker clip in the mass. No additional mass is
identified in the breast.

Mammographic images were processed with CAD.

Targeted ultrasound is performed, showing a spiculated hypoechoic
mass in the right breast at [DATE] 9 cm from the nipple measuring
x 1.4 x 1.5 cm. On the prior ultrasound dated 11/10/2018 it measured
2.6 x 1.8 x 2.6 cm. There is overlying skin thickening which is also
less pronounced than the prior exam. Sonographic evaluation of the
right axilla shows a lobulated abnormal lymph node with an echogenic
marker clip in it. The lymph node measures 10 mm. On the prior
ultrasound dated 11/10/2018 it measured 8 mm and was
well-circumscribed.
IMPRESSION: Interval decreased in the size of the spiculated mass in the [DATE]
region of the right breast. The right axillary lymph node is
slightly different in configuration but mildly larger.

RECOMMENDATION:
Continued treatment planning of the known right breast cancer and
metastatic axillary adenopathy is recommended.

I have discussed the findings and recommendations with the patient.
If applicable, a reminder letter will be sent to the patient
regarding the next appointment.

BI-RADS CATEGORY  6: Known biopsy-proven malignancy.

ADDENDUM:
At the time of the patient's diagnostic study on 02/13/2019 I was
not aware that the patient had an MRI on 12/13/2018 which showed an
additional suspicious mass in the upper inner quadrant of the right
breast. MRI guided core biopsy was recommended but has not been
performed. The patient will be recalled for second-look ultrasound
of the enhancing mass in the upper-inner quadrant of the right
breast. If the mass is not visualized sonographically MRI guided
core biopsy would be recommended.

ADDENDUM:
Patient returns for a second-look ultrasound. Patient had a
diagnostic workup on 02/13/2019 to assess response to neoadjuvant
estrogen therapy. Patient had an MRI on 12/13/2018 which showed an
additional mass in the upper inner quadrant of the breast and MR
guided core biopsy was recommended.

Comparison made to the prior MRI dated 12/13/2018.

Sonographic evaluation of the right breast does not show a
definitive second mass in the upper inner quadrant of the right
breast.

Continued treatment planning of the biopsy proven invasive mammary
carcinoma in the right breast is recommended. If clinically
indicated the second mass seen with MRI in the right breast could be
re-evaluated or biopsied with MRI.

*** End of Addendum ***
Addendum:
ACR Breast Density Category b: There are scattered areas of
fibroglandular density.
FINDINGS: The spiculated mass in the upper-outer quadrant of the right breast
appears smaller than the prior exam dated 11/10/2018. There is a
ribbon shaped biopsy marker clip in the mass. No additional mass is
identified in the breast.

Mammographic images were processed with CAD.

Targeted ultrasound is performed, showing a spiculated hypoechoic
mass in the right breast at [DATE] 9 cm from the nipple measuring
x 1.4 x 1.5 cm. On the prior ultrasound dated 11/10/2018 it measured
2.6 x 1.8 x 2.6 cm. There is overlying skin thickening which is also
less pronounced than the prior exam. Sonographic evaluation of the
right axilla shows a lobulated abnormal lymph node with an echogenic
marker clip in it. The lymph node measures 10 mm. On the prior
ultrasound dated 11/10/2018 it measured 8 mm and was
well-circumscribed.
IMPRESSION: Interval decreased in the size of the spiculated mass in the [DATE]
region of the right breast. The right axillary lymph node is
slightly different in configuration but mildly larger.

RECOMMENDATION:
Continued treatment planning of the known right breast cancer and
metastatic axillary adenopathy is recommended.

I have discussed the findings and recommendations with the patient.
If applicable, a reminder letter will be sent to the patient
regarding the next appointment.

BI-RADS CATEGORY  6: Known biopsy-proven malignancy.

ADDENDUM:
At the time of the patient's diagnostic study on 02/13/2019 I was
not aware that the patient had an MRI on 12/13/2018 which showed an
additional suspicious mass in the upper inner quadrant of the right
breast. MRI guided core biopsy was recommended but has not been
performed. The patient will be recalled for second-look ultrasound
of the enhancing mass in the upper-inner quadrant of the right
breast. If the mass is not visualized sonographically MRI guided
core biopsy would be recommended.

*** End of Addendum ***
ACR Breast Density Category b: There are scattered areas of
fibroglandular density.
FINDINGS: The spiculated mass in the upper-outer quadrant of the right breast
appears smaller than the prior exam dated 11/10/2018. There is a
ribbon shaped biopsy marker clip in the mass. No additional mass is
identified in the breast.

Mammographic images were processed with CAD.

Targeted ultrasound is performed, showing a spiculated hypoechoic
mass in the right breast at [DATE] 9 cm from the nipple measuring
x 1.4 x 1.5 cm. On the prior ultrasound dated 11/10/2018 it measured
2.6 x 1.8 x 2.6 cm. There is overlying skin thickening which is also
less pronounced than the prior exam. Sonographic evaluation of the
right axilla shows a lobulated abnormal lymph node with an echogenic
marker clip in it. The lymph node measures 10 mm. On the prior
ultrasound dated 11/10/2018 it measured 8 mm and was
well-circumscribed.
IMPRESSION: Interval decreased in the size of the spiculated mass in the [DATE]
region of the right breast. The right axillary lymph node is
slightly different in configuration but mildly larger.

RECOMMENDATION:
Continued treatment planning of the known right breast cancer and
metastatic axillary adenopathy is recommended.

I have discussed the findings and recommendations with the patient.
If applicable, a reminder letter will be sent to the patient
regarding the next appointment.

BI-RADS CATEGORY  6: Known biopsy-proven malignancy.
